# Patient Record
Sex: Female | Born: 1960 | Race: White | Hispanic: No | Marital: Married | Smoking: Never smoker
Health system: Southern US, Community
[De-identification: ages and names within clinical notes are randomized; demographics above are authoritative.]

## PROBLEM LIST (undated history)

## (undated) DIAGNOSIS — R197 Diarrhea, unspecified: Secondary | ICD-10-CM

## (undated) DIAGNOSIS — M779 Enthesopathy, unspecified: Secondary | ICD-10-CM

## (undated) DIAGNOSIS — N6009 Solitary cyst of unspecified breast: Secondary | ICD-10-CM

## (undated) DIAGNOSIS — M722 Plantar fascial fibromatosis: Secondary | ICD-10-CM

## (undated) DIAGNOSIS — K589 Irritable bowel syndrome without diarrhea: Secondary | ICD-10-CM

## (undated) DIAGNOSIS — L719 Rosacea, unspecified: Secondary | ICD-10-CM

## (undated) DIAGNOSIS — E785 Hyperlipidemia, unspecified: Secondary | ICD-10-CM

## (undated) DIAGNOSIS — R634 Abnormal weight loss: Secondary | ICD-10-CM

## (undated) DIAGNOSIS — E559 Vitamin D deficiency, unspecified: Secondary | ICD-10-CM

## (undated) DIAGNOSIS — I10 Essential (primary) hypertension: Secondary | ICD-10-CM

## (undated) HISTORY — DX: Enthesopathy, unspecified: M77.9

## (undated) HISTORY — PX: BREAST EXCISIONAL BIOPSY: SUR124

## (undated) HISTORY — DX: Rosacea, unspecified: L71.9

## (undated) HISTORY — DX: Abnormal weight loss: R63.4

## (undated) HISTORY — DX: Essential (primary) hypertension: I10

## (undated) HISTORY — DX: Solitary cyst of unspecified breast: N60.09

## (undated) HISTORY — DX: Vitamin D deficiency, unspecified: E55.9

## (undated) HISTORY — DX: Hyperlipidemia, unspecified: E78.5

## (undated) HISTORY — DX: Irritable bowel syndrome, unspecified: K58.9

## (undated) HISTORY — DX: Plantar fascial fibromatosis: M72.2

## (undated) HISTORY — DX: Diarrhea, unspecified: R19.7

---

## 1996-05-25 HISTORY — PX: VAGINAL HYSTERECTOMY: SUR661

## 1998-05-09 ENCOUNTER — Inpatient Hospital Stay (HOSPITAL_COMMUNITY): Admission: RE | Admit: 1998-05-09 | Discharge: 1998-05-11 | Payer: Self-pay | Admitting: Obstetrics and Gynecology

## 1998-11-14 ENCOUNTER — Other Ambulatory Visit: Admission: RE | Admit: 1998-11-14 | Discharge: 1998-11-14 | Payer: Self-pay | Admitting: Obstetrics and Gynecology

## 2000-01-30 ENCOUNTER — Other Ambulatory Visit: Admission: RE | Admit: 2000-01-30 | Discharge: 2000-01-30 | Payer: Self-pay | Admitting: Obstetrics and Gynecology

## 2000-05-25 HISTORY — PX: LAPAROSCOPIC CHOLECYSTECTOMY: SUR755

## 2001-04-05 ENCOUNTER — Ambulatory Visit (HOSPITAL_COMMUNITY): Admission: RE | Admit: 2001-04-05 | Discharge: 2001-04-05 | Payer: Self-pay | Admitting: Gastroenterology

## 2001-04-05 ENCOUNTER — Encounter (INDEPENDENT_AMBULATORY_CARE_PROVIDER_SITE_OTHER): Payer: Self-pay | Admitting: Specialist

## 2003-02-08 ENCOUNTER — Other Ambulatory Visit: Admission: RE | Admit: 2003-02-08 | Discharge: 2003-02-08 | Payer: Self-pay | Admitting: Obstetrics and Gynecology

## 2003-02-12 ENCOUNTER — Encounter: Admission: RE | Admit: 2003-02-12 | Discharge: 2003-02-12 | Payer: Self-pay | Admitting: Obstetrics and Gynecology

## 2003-02-12 ENCOUNTER — Encounter: Payer: Self-pay | Admitting: Obstetrics and Gynecology

## 2003-12-20 ENCOUNTER — Encounter: Admission: RE | Admit: 2003-12-20 | Discharge: 2003-12-20 | Payer: Self-pay | Admitting: Family Medicine

## 2004-01-21 ENCOUNTER — Observation Stay (HOSPITAL_COMMUNITY): Admission: RE | Admit: 2004-01-21 | Discharge: 2004-01-22 | Payer: Self-pay | Admitting: General Surgery

## 2004-01-21 ENCOUNTER — Encounter (INDEPENDENT_AMBULATORY_CARE_PROVIDER_SITE_OTHER): Payer: Self-pay | Admitting: Specialist

## 2004-05-09 ENCOUNTER — Other Ambulatory Visit: Admission: RE | Admit: 2004-05-09 | Discharge: 2004-05-09 | Payer: Self-pay | Admitting: Obstetrics and Gynecology

## 2004-07-16 ENCOUNTER — Ambulatory Visit (HOSPITAL_COMMUNITY): Admission: RE | Admit: 2004-07-16 | Discharge: 2004-07-16 | Payer: Self-pay | Admitting: Family Medicine

## 2004-10-21 ENCOUNTER — Ambulatory Visit: Payer: Self-pay | Admitting: Gastroenterology

## 2008-07-05 ENCOUNTER — Encounter: Admission: RE | Admit: 2008-07-05 | Discharge: 2008-07-05 | Payer: Self-pay | Admitting: Obstetrics and Gynecology

## 2008-07-09 ENCOUNTER — Encounter: Admission: RE | Admit: 2008-07-09 | Discharge: 2008-07-09 | Payer: Self-pay | Admitting: Obstetrics and Gynecology

## 2009-05-25 HISTORY — PX: BREAST CYST ASPIRATION: SHX578

## 2009-07-08 ENCOUNTER — Encounter: Admission: RE | Admit: 2009-07-08 | Discharge: 2009-07-08 | Payer: Self-pay | Admitting: Obstetrics and Gynecology

## 2010-05-08 ENCOUNTER — Encounter
Admission: RE | Admit: 2010-05-08 | Discharge: 2010-05-08 | Payer: Self-pay | Source: Home / Self Care | Attending: Obstetrics and Gynecology | Admitting: Obstetrics and Gynecology

## 2010-06-14 ENCOUNTER — Other Ambulatory Visit: Payer: Self-pay | Admitting: Obstetrics and Gynecology

## 2010-06-14 ENCOUNTER — Encounter: Payer: Self-pay | Admitting: Obstetrics and Gynecology

## 2010-06-14 DIAGNOSIS — Z1239 Encounter for other screening for malignant neoplasm of breast: Secondary | ICD-10-CM

## 2010-07-09 ENCOUNTER — Ambulatory Visit
Admission: RE | Admit: 2010-07-09 | Discharge: 2010-07-09 | Disposition: A | Discharge status: Home / Self Care | Payer: BC Managed Care – HMO | Source: Ambulatory Visit | Attending: Obstetrics and Gynecology | Admitting: Obstetrics and Gynecology

## 2010-07-09 DIAGNOSIS — Z1239 Encounter for other screening for malignant neoplasm of breast: Secondary | ICD-10-CM

## 2010-10-10 NOTE — Op Note (Signed)
Denise Gibson, Denise Gibson                       ACCOUNT NO.:  [id number]   MEDICAL RECORD NO.:  [id number]                   PATIENT TYPE:  OBV   LOCATION:  0457                                 FACILITY:  [facility]   PHYSICIAN:  Anselm Pancoast. Zachery Dakins, M.D.          DATE OF BIRTH:  06-18-1960   DATE OF PROCEDURE:  01/21/2004  DATE OF DISCHARGE:                                 OPERATIVE REPORT   PREOPERATIVE DIAGNOSIS:  Chronic cholecystitis with stones.   POSTOPERATIVE DIAGNOSIS:  Chronic cholecystitis with stones.   OPERATION:  Laparoscopic cholecystectomy with cholangiogram.   ANESTHESIA:  General.   SURGEON:  Dr. Consuello Bossier   ASSISTANT:  Dr. Leonie Man   HISTORY:  Ms. Babin is a 50 year old Caucasian female, who was referred to  me by Dr. Merri Brunette for symptomatic gallstones.  The patient has had  episodes of epigastric pain, had an episode for which she saw Dr. Archie Endo on December 18, 2003, who thought this was possibly gallbladder and sent  her for an ultrasound, and this showed a 2.4 cm stone with minimal sludge  within the gallbladder but no evidence of acute cholecystitis.  She has been  having episodes of indigestion for some time but never severe enough as  these episodes.  She has had a previous hysterectomy and endoscopic  procedures.  Preoperatively, her liver function studies are normal, and an  EKG was unremarkable.   She was given 3 g of Unasyn, has PAS stockings, and positioned on the OR  table.  Induction of general anesthesia, endotracheal tube, oral tube into  the stomach.  The abdomen was prepped with Betadine surgical scrub and  draped in a sterile manner.  A small incision was made below the umbilicus,  the fascia was identified, and this was picked up.  A small vertical  incision made, and a little finger pocket in the area.  The peritoneum was  kind of separated from the umbilicus, and we had to pick up the peritoneum  and made a small  opening within it.  I then placed the Hasson cannula after  a pursestring had been placed and then the cameras inserted.  She has  adhesions.  She has had a previous hysterectomy, and she has adhesions in  the upper abdomen.  Whether this has been related to the gallbladder or a  previous hysterectomy, I am not sure.  The upper 10 mm trocar was placed  under direct vision through the falciform ligament in the subxiphoid area,  and then I used the hot scissors to drop the adhesions down in the right  upper quadrant so Dr. Lurene Shadow could place the 5 mm ports.  These were placed  under direct vision.  Then the gallbladder was grasped and retracted upward.  It was necessary to take adhesions down from the duodenum to the  gallbladder, and this was done very carefully.  Then the gallbladder was  retracted  outward as well as upward.  And then first we dissected the cystic  artery which was encompassing fatty tissue from the superior medial edge of  the gallbladder.  This was doubly clipped proximally, singly distally, and  divided, and then I could encompass the little cystic duct.  This was  clipped flush with the gallbladder, and then a little opening was made.  Trying to get the [facility] catheter was difficult because of the angle, and we  finally got it which was placed in about a quarter of an inch and held in  place with a clip.  Injection x-ray was obtained under fluoroscopic vision,  and it goes promptly into the common bile duct and duodenal.  There was a  little __________ in the pancreatic duct, but there was no evidence of any  stones, and the intrahepatic radicles filled nicely.  The catheter was then  removed under direct vision.  The cystic duct was triply clipped and then  divided, and then there was a little posterior branch of the cystic artery  that was identified, doubly clipped, and then divided distally.  The  gallbladder was removed from its bed and then placed in the EndoCatch  bag.  I then switched the camera to the upper 10 mm port, withdrew the bag, and  then kind of opened the bag, pulled at the neck of the gallbladder and then  crushed the stone so I could bring it through the fascia without enlarging  it any more.  The figure-of-eight of 0 Vicryl was placed in the fascia, and  then both that and the pursestring suture were tied.  The irrigating fluid  had been used was aspirated.  The 5 mm ports were withdrawn.  I had  anesthetized all the fascia port sites with Marcaine with adrenalin.  The  subcutaneous wounds closed with 4-0 Vicryl and Benzoin and Steri-Strips on  the skin.  The patient tolerated the procedure nicely and was sent to  recovery room in a stable postoperative condition.                                               Anselm Pancoast. Zachery Dakins, M.D.    WJW/MEDQ  D:  01/21/2004  T:  01/21/2004  Job:  [id number]   cc:   Dario Guardian, M.D.  [address]  [place]  [place] [address]  Fax: [phone]

## 2011-02-23 DIAGNOSIS — N6009 Solitary cyst of unspecified breast: Secondary | ICD-10-CM

## 2011-02-23 HISTORY — DX: Solitary cyst of unspecified breast: N60.09

## 2011-03-09 HISTORY — PX: BREAST SURGERY: SHX581

## 2011-03-11 ENCOUNTER — Other Ambulatory Visit: Payer: Self-pay | Admitting: Obstetrics & Gynecology

## 2011-03-11 DIAGNOSIS — N6452 Nipple discharge: Secondary | ICD-10-CM

## 2011-03-16 ENCOUNTER — Other Ambulatory Visit: Payer: Self-pay | Admitting: Obstetrics & Gynecology

## 2011-03-16 ENCOUNTER — Ambulatory Visit
Admission: RE | Admit: 2011-03-16 | Discharge: 2011-03-16 | Disposition: A | Discharge status: Home / Self Care | Payer: BC Managed Care – HMO | Source: Ambulatory Visit | Attending: Obstetrics & Gynecology | Admitting: Obstetrics & Gynecology

## 2011-03-16 ENCOUNTER — Ambulatory Visit
Admission: RE | Admit: 2011-03-16 | Discharge: 2011-03-16 | Disposition: A | Discharge status: Home / Self Care | Payer: Self-pay | Source: Ambulatory Visit | Attending: Obstetrics & Gynecology | Admitting: Obstetrics & Gynecology

## 2011-03-16 DIAGNOSIS — N6452 Nipple discharge: Secondary | ICD-10-CM

## 2011-03-23 ENCOUNTER — Ambulatory Visit (INDEPENDENT_AMBULATORY_CARE_PROVIDER_SITE_OTHER): Payer: BC Managed Care – HMO | Admitting: General Surgery

## 2011-03-23 ENCOUNTER — Encounter (INDEPENDENT_AMBULATORY_CARE_PROVIDER_SITE_OTHER): Payer: Self-pay | Admitting: General Surgery

## 2011-03-23 VITALS — BP 142/96 | HR 64 | Temp 97.5°F | Resp 20 | Ht 63.0 in | Wt 140.2 lb

## 2011-03-23 DIAGNOSIS — D249 Benign neoplasm of unspecified breast: Secondary | ICD-10-CM

## 2011-03-23 NOTE — Progress Notes (Signed)
Chief Complaint  Patient presents with  . New Evaluation    eval right breast cyst     HPI Denise Gibson is a 50 y.o. female.   HPI This woman was referred to me by Dr. Baird Lyons at the breast center of [place].  The patient had mammograms on July 09, 2010 which were normal. Approximately one month ago she developed a spontaneous clear discharge from her right nipple. She did not feel a lump. There is not any pain or trauma. This has never happened to her before. A right breast mammogram was unremarkable. A ductogram was performed through the discharging nipple and this reveals intraluminal mass and papilloma is suspected by the radiologist.  Family history is positive for breast cancer in a maternal grandmother. There is no history of ovarian cancer. The patient has not had any history of breast problems or breast surgery. She states she has fibrocystic disease and lumpy breasts.  Past Medical History  Diagnosis Date  . Weight loss, unintentional   . Diarrhea   . IBS (irritable bowel syndrome)   . Breast cyst october 2012    rigth breast    Past Surgical History  Procedure Date  . Cholecystectomy   . Abdominal hysterectomy 1998    History reviewed. No pertinent family history.  Social History History  Substance Use Topics  . Smoking status: Never Smoker   . Smokeless tobacco: Never Used  . Alcohol Use: No    No Known Allergies  Current Outpatient Prescriptions  Medication Sig Dispense Refill  . CALCIUM PO Take by mouth daily.        Marland Kitchen Fexofenadine HCl (ALLEGRA PO) Take by mouth as needed.        . Ranitidine HCl (RANITIDINE ACID REDUCER PO) Take by mouth every hemodialysis.          Review of Systems Review of Systems  Constitutional: Negative for fever, chills and unexpected weight change.  HENT: Negative for hearing loss, congestion, sore throat, trouble swallowing and voice change.   Eyes: Negative for visual disturbance.  Respiratory: Negative  for cough and wheezing.   Cardiovascular: Negative for chest pain, palpitations and leg swelling.  Gastrointestinal: Negative for nausea, vomiting, abdominal pain, diarrhea, constipation, blood in stool, abdominal distention and anal bleeding.  Genitourinary: Negative for hematuria, vaginal bleeding and difficulty urinating.  Musculoskeletal: Negative for arthralgias.  Skin: Negative for rash and wound.  Neurological: Negative for seizures, syncope and headaches.  Hematological: Negative for adenopathy. Does not bruise/bleed easily.  Psychiatric/Behavioral: Negative for confusion.    Blood pressure 142/96, pulse 64, temperature 97.5 F (36.4 C), temperature source Temporal, resp. rate 20, height 5\' 3"  (1.6 m), weight 140 lb 4 oz (63.617 kg).  Physical Exam Physical Exam  Constitutional: She is oriented to person, place, and time. She appears well-developed and well-nourished. No distress.  HENT:  Head: Normocephalic and atraumatic.  Nose: Nose normal.  Mouth/Throat: No oropharyngeal exudate.  Eyes: Conjunctivae and EOM are normal. Pupils are equal, round, and reactive to light. Left eye exhibits no discharge. No scleral icterus.  Neck: Neck supple. No JVD present. No tracheal deviation present. No thyromegaly present.  Cardiovascular: Normal rate, regular rhythm, normal heart sounds and intact distal pulses.   No murmur heard. Pulmonary/Chest: Effort normal and breath sounds normal. No respiratory distress. She has no wheezes. She has no rales. She exhibits no tenderness.    Abdominal: Soft. Bowel sounds are normal. She exhibits no distension and no mass. There is  no tenderness. There is no rebound and no guarding.  Musculoskeletal: She exhibits no edema and no tenderness.  Lymphadenopathy:    She has no cervical adenopathy.  Neurological: She is alert and oriented to person, place, and time. She exhibits normal muscle tone. Coordination normal.  Skin: Skin is warm. No rash noted.  She is not diaphoretic. No erythema. No pallor.  Psychiatric: She has a normal mood and affect. Her behavior is normal. Judgment and thought content normal.    Data Reviewed  I reviewed her mammograms and her ductogram films and reports.  Assessment    Spontaneous nipple discharge right breast, clear in character. Abnormal ductogram suggesting intraductal papilloma.  Mild gastroesophageal reflux disease.    Plan    Schedule for excision right breast duct system with lacrimal duct probe localization.  I've discussed indications and details of surgery with the patient. Risks and complications have been outlined, including but not limited to bleeding, infection, cosmetic deformity, nerve damage with chronic pain or numbness, reoperation if this turns out to be malignant or if it fails to identify the problem, and other unforseen problems. She seems to understand the issues well. At this time all of her questions were answered. She is in full agreement with this plan.       Denise Gibson M 03/23/2011, 3:01 PM

## 2011-03-23 NOTE — Patient Instructions (Signed)
you will be scheduled for surgery to remove the mass in your right breast that is causing the discharge.  Breast Biopsy WHY YOU NEED A BIOPSY Your caregiver has recommended that you have a breast tissue sample taken (biopsy). This is done to be certain that the lump or abnormality found in your breast is not cancerous (malignant). During a biopsy, a small piece of tissue is removed, so it can be examined under a microscope by a specialist (pathologist) who looks at tissues and cells and diagnoses abnormalities in them. Most lumps (tumors) or abnormalities, on or in the breast, are not cancerous (benign). However, biopsies are taken when your caregiver cannot be absolutely certain of what is wrong only from doing a physical exam, mammogram (breast X-ray), or other studies. A breast biopsy can tell you whether nothing more needs to be done, or you need more surgery or another type of treatment. A biopsy is done when there is:  Any undiagnosed breast mass.   Nipple abnormalities, dimpling, crusting, or ulcerations.   Calcium deposits (calcifications) or abnormalities seen on your mammogram, ultrasound, or MRI.   Suspicious changes in the breast (thickening, asymmetry) seen on mammogram.   Abnormal discharge from the nipple, especially blood.   Redness, swelling, and pain of the breast.  HOW A BIOPSY IS PERFORMED A biopsy is often performed on an outpatient basis (you go home the same day). This can be done in a hospital, clinic, or surgical center. Tissue samples (biopsies) are often done under local anesthesia (area is numbed). Sometimes general anesthetics are required, in which case you sleep through the procedure. Biopsies may remove the entire lump, a small piece of the lump, or a small sliver of tissue removed by needle. TYPES OF BREAST BIOPSY  Fine needle aspiration. A thin needle is placed through the skin, to the lump or cyst, and cells are removed.   Core needle biopsy. A large needle  with a special tip is placed through the skin, to the abnormality, and a piece of tissue is removed.   Stereotactic biopsy. A core needle with a special X-ray is used, to direct the needle to the lump or abnormal area, which is difficult to feel or cannot be felt.   Vacuum-assisted biopsy. A hollow probe and a gentle vacuum remove a sample of tissue.   Ultrasound guided core needle biopsy. You lie on your stomach, with your breast through an opening, and a high frequency ultrasound helps guide the needle to the area of the abnormality.   Open biopsy. An incision is made in the breast, and a piece of the lump or the whole lump is removed.  LET YOUR CAREGIVER KNOW ABOUT:  Allergies.   Medicines taken, including herbs, eye drops, over-the-counter medicines, and creams.   Use of steroids (by mouth or creams).   Previous problems with anesthetics or Novocaine.   If you are taking aspirin or blood thinners.   Possibility of pregnancy, if this applies.   History of blood clots (thrombophlebitis).   History of bleeding or blood problems.   Previous surgery.   Other health problems.  RISKS AND COMPLICATIONS   Bleeding.   Infection.   Allergy to medicines.   Bruising and swelling of the breast.   Alteration in the shape of the breast.   Not finding the lump or abnormality.   Needing more surgery.  BEFORE THE PROCEDURE  You should arrive 60 minutes prior to your procedure or as directed.   Check-in at  the admissions desk, to fill out necessary forms, if you are not preregistered.   There will be consent forms to sign, prior to the procedure.   There is a waiting area for your family, while you are having your biopsy.   Try to have someone with you, to drive you home.   Do not smoke for 2 weeks before the surgery.   Let your caregiver know if you develop a cold or an infection.   Do not drink alcohol for at least 24 hours before surgery.   Wear a good support bra to  the surgery.  AFTER THE PROCEDURE  After surgery, you will be taken to the recovery area, where a nurse will watch and check your progress. Once you are awake, stable, and taking fluids well, if there are no other problems, you will be allowed to go home.   Ice packs applied to your operative site may help with discomfort and keep the swelling down.   You may resume normal diet and activities as directed. Avoid strenuous activities affecting the arm on the side of the biopsy, such as tennis, swimming, heavy lifting (more than 10 pounds) or pulling.   Bruising in the breast is normal following this procedure.   Wearing a support bra, even to bed, may be more comfortable. The bra will also help keep the dressing on.   Change dressings as directed.   Your doctor may apply a pressure dressing on your breast for 24 to 48 hours.   Only take over-the-counter or prescription medicines for pain, discomfort, or fever as directed by your caregiver.   Do not take aspirin, because it can cause bleeding.  HOME CARE INSTRUCTIONS   You may resume your usual diet.   Have someone drive you home after the surgery.   Do not do any exercise, driving, lifting or general activities without your caregiver's permission.   Take medicines and over-the-counter medicines, as ordered by your caregiver.   Keep your postoperative appointments as recommended.   Do not drink alcohol while taking pain medicine.  Finding out the results of your test Not all test results are available during your visit. If your test results are not back during the visit, make an appointment with your caregiver to find out the results. Do not assume everything is normal if you have not heard from your caregiver or the medical facility. It is important for you to follow up on all of your test results.  SEEK MEDICAL CARE IF:   You notice redness, swelling, or increasing pain in the wound.   You notice a bad smell coming from the  wound or dressing.   You develop a rash.   You need stronger pain medicine.   You are having an allergic reaction or problems with your medicines.  SEEK IMMEDIATE MEDICAL CARE IF:   You have difficulty breathing.   You have a fever.   There is increased bleeding (more than a small spot) from the wound.   Pus is coming from the wound.   The wound is breaking open.  Document Released: 05/11/2005 Document Revised: 01/21/2011 Document Reviewed: 03/29/2009 [facility] Patient Information 2012 [place], [place].

## 2011-03-25 HISTORY — PX: CHOLECYSTECTOMY: SHX55

## 2011-04-09 ENCOUNTER — Other Ambulatory Visit (HOSPITAL_COMMUNITY)
Admission: RE | Admit: 2011-04-09 | Discharge: 2011-04-09 | Disposition: A | Discharge status: Home / Self Care | Payer: BC Managed Care – HMO | Source: Ambulatory Visit | Attending: General Surgery | Admitting: General Surgery

## 2011-04-09 ENCOUNTER — Other Ambulatory Visit (INDEPENDENT_AMBULATORY_CARE_PROVIDER_SITE_OTHER): Payer: Self-pay | Admitting: General Surgery

## 2011-04-09 DIAGNOSIS — D249 Benign neoplasm of unspecified breast: Secondary | ICD-10-CM

## 2011-04-09 DIAGNOSIS — N6459 Other signs and symptoms in breast: Secondary | ICD-10-CM | POA: Insufficient documentation

## 2011-04-13 ENCOUNTER — Telehealth (INDEPENDENT_AMBULATORY_CARE_PROVIDER_SITE_OTHER): Payer: Self-pay

## 2011-04-13 NOTE — Telephone Encounter (Signed)
Pt notified of path result. Pt declined to make po appt. Pt states she needs to get her new schedule to set date. Pt to call back once she has schedule to set po appt date.

## 2011-04-27 ENCOUNTER — Ambulatory Visit (INDEPENDENT_AMBULATORY_CARE_PROVIDER_SITE_OTHER): Payer: BC Managed Care – HMO | Admitting: General Surgery

## 2011-04-27 ENCOUNTER — Encounter (INDEPENDENT_AMBULATORY_CARE_PROVIDER_SITE_OTHER): Payer: Self-pay | Admitting: General Surgery

## 2011-04-27 VITALS — BP 118/84 | HR 72 | Temp 96.8°F | Resp 16 | Ht 63.0 in | Wt 141.4 lb

## 2011-04-27 DIAGNOSIS — Z9889 Other specified postprocedural states: Secondary | ICD-10-CM

## 2011-04-27 NOTE — Patient Instructions (Signed)
Your right breast incision is healing very nicely. The final pathology report shows a benign papilloma. You have a copy of the pathology report. I recommended that you get mammograms yearly and have a breast exam with one of your other physicians on a yearly basis. See me if new problems arise.

## 2011-04-27 NOTE — Progress Notes (Signed)
Subjective:     Patient ID: Denise Gibson, female   DOB: 07-Mar-1961, 50 y.o.   MRN: [id number]  HPI The patient returns, status post right breast biopsy through circumareolar incision on November 15. The final report shows intraductal papilloma, no evidence of malignancy. She was given a copy of her pathology report. She has no complaints about her wound.  Review of Systems     Objective:   Physical Exam Right breast is examined. The incision is healing nicely. The breast tissues are soft. No hematoma. Excellent cosmetic result. No further drainage from the nipple.    Assessment:     Intraductal papilloma right breast, recovering uneventfully following right breast biopsy.    Plan:     She was advised to get annual mammography and annual breast exam with her primary physicians.  Return to see me p.r.n.

## 2012-09-29 ENCOUNTER — Other Ambulatory Visit: Payer: Self-pay

## 2012-09-29 DIAGNOSIS — Z1231 Encounter for screening mammogram for malignant neoplasm of breast: Secondary | ICD-10-CM

## 2012-10-11 ENCOUNTER — Encounter: Payer: Self-pay | Admitting: Obstetrics and Gynecology

## 2012-10-11 ENCOUNTER — Ambulatory Visit (INDEPENDENT_AMBULATORY_CARE_PROVIDER_SITE_OTHER): Payer: BC Managed Care – HMO | Admitting: Obstetrics and Gynecology

## 2012-10-11 VITALS — BP 134/82 | Ht 63.5 in | Wt 140.0 lb

## 2012-10-11 DIAGNOSIS — Z01419 Encounter for gynecological examination (general) (routine) without abnormal findings: Secondary | ICD-10-CM

## 2012-10-11 DIAGNOSIS — Z0000 Encounter for general adult medical examination without abnormal findings: Secondary | ICD-10-CM

## 2012-10-11 LAB — POCT URINALYSIS DIPSTICK
Bilirubin, UA: NEGATIVE
Nitrite, UA: NEGATIVE
Urobilinogen, UA: NEGATIVE

## 2012-10-11 NOTE — Patient Instructions (Signed)

## 2012-10-11 NOTE — Progress Notes (Signed)
52 y.o.   Married    Caucasian   female   G1P1001   here for annual exam.  Recently diagnosed with bilat tendonitis in elbows and had MRI on right which showed a tear.  Otherwise good year.  No other new health hx.  No LMP recorded. Patient has had a hysterectomy.          Sexually active: yes  The current method of family planning is status post hysterectomy.    Exercising: not now Last mammogram:  03/16/11 had surgery 03/2011, scheduled soon Last pap smear:2005 History of abnormal pap: no Smoking: never Alcohol: no Last colonoscopy: 20 years ago normal - encouraged to schedule Last Bone Density:  never Last tetanus shot: less than 10 years Last cholesterol check: not sure  Hgb:        neg        Urine:13.0   Family History  Problem Relation Age of Onset  . Hypertension Mother   . COPD Mother   . Fibromyalgia Mother   . Hyperlipidemia Father     There are no active problems to display for this patient.   Past Medical History  Diagnosis Date  . Weight loss, unintentional   . Diarrhea   . IBS (irritable bowel syndrome)   . Breast cyst october 2012    rigth breast  . Tendinitis     Rt arm 2011, Lt arm 01/2012    Past Surgical History  Procedure Laterality Date  . Cholecystectomy  03/25/11    Laparoscopy  . Vaginal hysterectomy  1998    DUB  . Laparoscopic cholecystectomy  2002  . Breast surgery Right 03/09/11    exc of right breast duct system   . Breast cyst aspiration Left 2011    5cc    Allergies: Review of patient's allergies indicates no known allergies.  Current Outpatient Prescriptions  Medication Sig Dispense Refill  . Acetaminophen (TYLENOL PO) Take by mouth as needed.      Marland Kitchen Fexofenadine HCl (ALLEGRA PO) Take by mouth as needed.        . Ranitidine HCl (RANITIDINE ACID REDUCER PO) Take by mouth every hemodialysis.         No current facility-administered medications for this visit.    ROS: Pertinent items are noted in HPI.  Social Hx:  Married, one child, works as a Conservator, museum/gallery and she thinks that may be why she is having pain in her elbows   Exam:    BP 134/82  Ht 5' 3.5" (1.613 m)  Wt 140 lb (63.504 kg)  BMI 24.41 kg/m2  Height stable and weight up one pound from last year Wt Readings from Last 3 Encounters:  10/11/12 140 lb (63.504 kg)  04/27/11 141 lb 6 oz (64.127 kg)  03/23/11 140 lb 4 oz (63.617 kg)     Ht Readings from Last 3 Encounters:  10/11/12 5' 3.5" (1.613 m)  04/27/11 5\' 3"  (1.6 m)  03/23/11 5\' 3"  (1.6 m)    General appearance: alert, cooperative and appears stated age Head: Normocephalic, without obvious abnormality, atraumatic Neck: no adenopathy, supple, symmetrical, trachea midline and thyroid not enlarged, symmetric, no tenderness/mass/nodules Lungs: clear to auscultation bilaterally Breasts: Inspection negative, No nipple retraction or dimpling, No nipple discharge or bleeding, No axillary or supraclavicular adenopathy, Normal to palpation without dominant masses Heart: regular rate and rhythm Abdomen: soft, non-tender; bowel sounds normal; no masses,  no organomegaly Extremities: extremities normal, atraumatic, no cyanosis or edema  Skin: Skin color, texture, turgor normal. No rashes or lesions Lymph nodes: Cervical, supraclavicular, and axillary nodes normal. No abnormal inguinal nodes palpated Neurologic: Grossly normal   Pelvic: External genitalia:  no lesions              Urethra:  normal appearing urethra with no masses, tenderness or lesions              Bartholins and Skenes: normal                 Vagina: normal appearing vagina with normal color and discharge, no lesions              Cervix: absent              Pap taken: no        Bimanual Exam:  Uterus: absent                                      Adnexa: normal adnexa in size, nontender and no masses                                      Rectovaginal: Confirms                                      Anus:  normal  sphincter tone, no lesions  A: normal peri-menopausal exam     S/p TVH     P: mammogram counseled on breast self exam, mammography screening, adequate intake of calcium and vitamin D, diet and exercise return annually or prn     An After Visit Summary was printed and given to the patient.

## 2012-10-26 ENCOUNTER — Ambulatory Visit
Admission: RE | Admit: 2012-10-26 | Discharge: 2012-10-26 | Disposition: A | Discharge status: Home / Self Care | Payer: BC Managed Care – PPO | Source: Ambulatory Visit

## 2012-10-26 DIAGNOSIS — Z1231 Encounter for screening mammogram for malignant neoplasm of breast: Secondary | ICD-10-CM

## 2013-01-04 ENCOUNTER — Telehealth: Payer: Self-pay | Admitting: Obstetrics and Gynecology

## 2013-01-04 NOTE — Telephone Encounter (Signed)
Patient needs to have referral for Dr. Randa Evens for Colonoscopy . Please call to let her know the details. Patient states that Dr. Tresa Res said she needs it for her yearly

## 2013-01-04 NOTE — Telephone Encounter (Signed)
Patient stated she would need referral to Dr. Carman Ching office for consult colonoscopy. Called [facility] office to give referral and was told one was not necessary. Receptionist stated that Denise Gibson had already called their office and was told that they needed her previous records from gastroenterologist and waiting on those records for review then patient will be given appt.. Patient notified of this and is concerned that her insurance will require a referral for appt..  Patient is to call [facility] office back and confirm this.

## 2013-01-04 NOTE — Telephone Encounter (Signed)
Left message on CB# of need to return call concerning referral for colonoscopy.

## 2013-03-28 ENCOUNTER — Telehealth: Payer: Self-pay | Admitting: Obstetrics and Gynecology

## 2013-03-28 NOTE — Telephone Encounter (Signed)
Patient is having hot flashes for the last few months gotten more frequent thinks she may be premenopause

## 2013-03-29 NOTE — Telephone Encounter (Signed)
Message left to return call to Antjuan Rothe at [phone].    

## 2013-03-29 NOTE — Telephone Encounter (Signed)
Appt scheduled with Dr. Edward Jolly to discuss hot flashes.

## 2013-03-31 ENCOUNTER — Ambulatory Visit (INDEPENDENT_AMBULATORY_CARE_PROVIDER_SITE_OTHER): Payer: BC Managed Care – HMO | Admitting: Obstetrics and Gynecology

## 2013-03-31 ENCOUNTER — Encounter: Payer: Self-pay | Admitting: Obstetrics and Gynecology

## 2013-03-31 DIAGNOSIS — N951 Menopausal and female climacteric states: Secondary | ICD-10-CM

## 2013-03-31 MED ORDER — ESTRADIOL 0.5 MG PO TABS: 0.5000 mg | ORAL_TABLET | Freq: Every day | ORAL | Status: DC

## 2013-03-31 NOTE — Progress Notes (Signed)
Patient ID: Denise Gibson, female   DOB: 1960-06-13, 52 y.o.   MRN: [id number] GYNECOLOGY PROBLEM VISIT  PCP:   Merri Brunette, MD  Referring provider:   HPI: 52 y.o.   Married  Caucasian  female   G1P1001 with No LMP recorded. Patient has had a hysterectomy.   here for  Discussion regarding hot flashes.  Some night sweats. 6 - 7 hot flashes per day. Sleeps well. Up to void at night once to twice a night. Good cognitive function.  Some vaginal dryness.   Maternal grandmother with breast cancer in menopause. Patient's mother is having evaluation for breast atypia currently.    Patient states that she has "running fits"  An overwhelming sense of gloom and doom. Has been occurring since adulthood.  Happens once to twice a year.   No particular anxiety.  No pain.  Some nausea but no vomiting when it happens.   Increased recently. No known stress.   Patient's mother also has this.   GYNECOLOGIC HISTORY: No LMP recorded. Patient has had a hysterectomy. Sexually active:  yes Partner preference: males Contraception: hysterectomy   Menopausal hormone therapy: no DES exposure:   no Blood transfusions:   no Sexually transmitted diseases:   no GYN Procedures:  Total vaginal hysterectomy Mammogram:    10-26-12 wnl       Status post excision of right breast papilloma 2012.       Pap:  ?2005 wnl  History of abnormal pap smear:  no   OB History   Grav Para Term Preterm Abortions TAB SAB Ect Mult Living   1 1 1       1          Family History  Problem Relation Age of Onset  . Hypertension Mother   . COPD Mother   . Fibromyalgia Mother   . Hyperlipidemia Father     There are no active problems to display for this patient.   Past Medical History  Diagnosis Date  . Weight loss, unintentional   . Diarrhea   . IBS (irritable bowel syndrome)   . Breast cyst october 2012    rigth breast  . Tendinitis     Rt arm 2011, Lt arm 01/2012    Past Surgical History  Procedure  Laterality Date  . Cholecystectomy  03/25/11    Laparoscopy  . Vaginal hysterectomy  1998    DUB  . Laparoscopic cholecystectomy  2002  . Breast surgery Right 03/09/11    exc of right breast duct system   . Breast cyst aspiration Left 2011    5cc    ALLERGIES: Review of patient's allergies indicates no known allergies.  Current Outpatient Prescriptions  Medication Sig Dispense Refill  . Acetaminophen (TYLENOL PO) Take by mouth as needed.      . calcium carbonate (OS-CAL) 600 MG TABS tablet Take 600 mg by mouth 2 (two) times daily with a meal.      . Calcium Carbonate-Vitamin D (CALCIUM 600+D PO) Take by mouth.      . Fexofenadine HCl (ALLEGRA PO) Take by mouth as needed.        . Ranitidine HCl (RANITIDINE ACID REDUCER PO) Take by mouth every hemodialysis.         No current facility-administered medications for this visit.     ROS:  Pertinent items are noted in HPI.  SOCIAL HISTORY:    PHYSICAL EXAMINATION:    There were no vitals taken for this visit.  Wt Readings from Last 3 Encounters:  10/11/12 140 lb (63.504 kg)  04/27/11 141 lb 6 oz (64.127 kg)  03/23/11 140 lb 4 oz (63.617 kg)     Ht Readings from Last 3 Encounters:  10/11/12 5' 3.5" (1.613 m)  04/27/11 5\' 3"  (1.6 m)  03/23/11 5\' 3"  (1.6 m)    General appearance: alert, cooperative and appears stated age Neurologic: Grossly normal  ASSESSMENT  Menopausal symptoms.  No contraindications to initiation of ERT. Possible anxiety.  PLAN  Menopausal symptoms discussed in detail with patient. Options for treatment including complimentary medicine, estrogen therapy, and Brisdelle discussed in detail including efficacy, risks and benefits of each.  Patient opts for estrogen therapy.  Will prescribe Estrace 0.5 mg daily.  Discussed potential risk of DVT, PE, MI, stroke, breast cancer while taking estrogens, although some of these risks as less likely due to estrogen alone therapy.  Medications per Epic  orders Return in three months.    An After Visit Summary was printed and given to the patient.  20 minutes face to face time of which over 50% was spent in counseling the patient.

## 2013-03-31 NOTE — Patient Instructions (Signed)
Estradiol tablets What is this medicine? ESTRADIOL (es tra DYE ole) is an estrogen. It is mostly used as hormone replacement in menopausal women. It helps to treat hot flashes and prevent osteoporosis. It is also used to treat women with low estrogen levels or those who have had their ovaries removed. This medicine may be used for other purposes; ask your health care provider or pharmacist if you have questions. COMMON BRAND NAME(S): Estrace, Femtrace, Gynodiol  What should I tell my health care provider before I take this medicine? They need to know if you have or ever had any of these conditions: -abnormal vaginal bleeding -blood vessel disease or blood clots -breast, cervical, endometrial, ovarian, liver, or uterine cancer -dementia -diabetes -gallbladder disease -heart disease or recent heart attack -high blood pressure -high cholesterol -high level of calcium in the blood -hysterectomy -kidney disease -liver disease -migraine headaches -protein C deficiency -protein S deficiency -stroke -systemic lupus erythematosus (SLE) -tobacco smoker -an unusual or allergic reaction to estrogens, other hormones, medicines, foods, dyes, or preservatives -pregnant or trying to get pregnant -breast-feeding How should I use this medicine? Take this medicine by mouth. To reduce nausea, this medicine may be taken with food. Follow the directions on the prescription label. Take this medicine at the same time each day and in the order directed on the package. Do not take your medicine more often than directed. Contact your pediatrician regarding the use of this medicine in children. Special care may be needed. A patient package insert for the product will be given with each prescription and refill. Read this sheet carefully each time. The sheet may change frequently. Overdosage: If you think you have taken too much of this medicine contact a poison control center or emergency room at once. NOTE:  This medicine is only for you. Do not share this medicine with others. What if I miss a dose? If you miss a dose, take it as soon as you can. If it is almost time for your next dose, take only that dose. Do not take double or extra doses. What may interact with this medicine? Do not take this medicine with any of the following medications: -aromatase inhibitors like aminoglutethimide, anastrozole, exemestane, letrozole, testolactone This medicine may also interact with the following medications: -carbamazepine -certain antibiotics used to treat infections -certain barbiturates or benzodiazepines used for inducing sleep or treating seizures -grapefruit juice -medicines for fungus infections like itraconazole and ketoconazole -raloxifene or tamoxifen -rifabutin, rifampin, or rifapentine -ritonavir -St. John's Wort -warfarin This list may not describe all possible interactions. Give your health care provider a list of all the medicines, herbs, non-prescription drugs, or dietary supplements you use. Also tell them if you smoke, drink alcohol, or use illegal drugs. Some items may interact with your medicine. What should I watch for while using this medicine? Visit your doctor or health care professional for regular checks on your progress. You will need a regular breast and pelvic exam and Pap smear while on this medicine. You should also discuss the need for regular mammograms with your health care professional, and follow his or her guidelines for these tests. This medicine can make your body retain fluid, making your fingers, hands, or ankles swell. Your blood pressure can go up. Contact your doctor or health care professional if you feel you are retaining fluid. If you have any reason to think you are pregnant, stop taking this medicine right away and contact your doctor or health care professional. Smoking increases the risk   of getting a blood clot or having a stroke while you are taking this  medicine, especially if you are more than 52 years old. You are strongly advised not to smoke. If you wear contact lenses and notice visual changes, or if the lenses begin to feel uncomfortable, consult your eye doctor or health care professional. This medicine can increase the risk of developing a condition (endometrial hyperplasia) that may lead to cancer of the lining of the uterus. Taking progestins, another hormone drug, with this medicine lowers the risk of developing this condition. Therefore, if your uterus has not been removed (by a hysterectomy), your doctor may prescribe a progestin for you to take together with your estrogen. You should know, however, that taking estrogens with progestins may have additional health risks. You should discuss the use of estrogens and progestins with your health care professional to determine the benefits and risks for you. If you are going to have surgery, you may need to stop taking this medicine. Consult your health care professional for advice before you schedule the surgery. What side effects may I notice from receiving this medicine? Side effects that you should report to your doctor or health care professional as soon as possible: -allergic reactions like skin rash, itching or hives, swelling of the face, lips, or tongue -breast tissue changes or discharge -changes in vision -chest pain -confusion, trouble speaking or understanding -dark urine -general ill feeling or flu-like symptoms -light-colored stools -nausea, vomiting -pain, swelling, warmth in the leg -right upper belly pain -severe headaches -shortness of breath -sudden numbness or weakness of the face, arm or leg -trouble walking, dizziness, loss of balance or coordination -unusual vaginal bleeding -yellowing of the eyes or skin Side effects that usually do not require medical attention (report to your doctor or health care professional if they continue or are bothersome): -hair  loss -increased hunger or thirst -increased urination -symptoms of vaginal infection like itching, irritation or unusual discharge -unusually weak or tired This list may not describe all possible side effects. Call your doctor for medical advice about side effects. You may report side effects to FDA at 1-800-FDA-1088. Where should I keep my medicine? Keep out of the reach of children. Store at room temperature between 20 and 25 degrees C (68 and 77 degrees F). Keep container tightly closed. Protect from light. Throw away any unused medicine after the expiration date. NOTE: This sheet is a summary. It may not cover all possible information. If you have questions about this medicine, talk to your doctor, pharmacist, or health care provider.  2014, [employer]. (2010-08-13 09:19:24)  

## 2013-06-30 ENCOUNTER — Telehealth: Payer: Self-pay | Admitting: Emergency Medicine

## 2013-06-30 MED ORDER — ESTRADIOL 0.5 MG PO TABS: 0.5000 mg | ORAL_TABLET | Freq: Every day | ORAL | Status: DC

## 2013-06-30 NOTE — Telephone Encounter (Signed)
Spoke with patient. She states that her symptoms are 85-90% better and she states "I am feeling good!".  She states for the first 30 days on Estradiol 0.5 mg she had no hot flashes, since then she has hot flashes about every 2-3 days but they are less in intensity and "not as awful." She is agreeable to reschedule of appointment and appointment rescheduled for 2/20 at 3:30 with Dr. Quincy Simmonds. Refilled Estradiol until next appointment. Patient will call back with any concerns prior to appointment.   Routing to provider for final review. Patient agreeable to disposition. Will close encounter

## 2013-06-30 NOTE — Telephone Encounter (Signed)
Message left to return call.  We need to reschedule appointment with Dr. Quincy Simmonds.  Can assess how she is doing on Estrace and if doing well, can refill until next follow up scheduled, okay with Dr. Quincy Simmonds.

## 2013-07-03 ENCOUNTER — Ambulatory Visit: Payer: BC Managed Care – HMO | Admitting: Obstetrics and Gynecology

## 2013-07-12 ENCOUNTER — Telehealth: Payer: Self-pay | Admitting: Obstetrics and Gynecology

## 2013-07-12 NOTE — Telephone Encounter (Signed)
Routing to provider for final review. Patient agreeable to disposition. Will close encounter.     

## 2013-07-12 NOTE — Telephone Encounter (Signed)
Patient canceled her appt on Friday for a 57mth reck. Patient said she is just unable to come that day. i dont have anything anytime soon i can put her in. Can you find her an appt.

## 2013-07-12 NOTE — Telephone Encounter (Signed)
This is a medication recheck, ok to delay a little.  Offer the afternoon slots on 07-27-13. We have given her  A one month refill so she should be ok.

## 2013-07-12 NOTE — Telephone Encounter (Signed)
Called pt scheduled her for 07-27-13 at 3:00.

## 2013-07-14 ENCOUNTER — Ambulatory Visit: Payer: BC Managed Care – HMO | Admitting: Obstetrics and Gynecology

## 2013-07-27 ENCOUNTER — Ambulatory Visit (INDEPENDENT_AMBULATORY_CARE_PROVIDER_SITE_OTHER): Payer: BC Managed Care – PPO | Admitting: Obstetrics and Gynecology

## 2013-07-27 ENCOUNTER — Encounter: Payer: Self-pay | Admitting: Obstetrics and Gynecology

## 2013-07-27 VITALS — BP 118/84 | HR 73 | Resp 14 | Wt 148.0 lb

## 2013-07-27 DIAGNOSIS — H532 Diplopia: Secondary | ICD-10-CM

## 2013-07-27 DIAGNOSIS — N951 Menopausal and female climacteric states: Secondary | ICD-10-CM

## 2013-07-27 NOTE — Patient Instructions (Addendum)
Please stop your estrogen therapy today.        Paroxetine capsules What is this medicine? PAROXETINE (pa ROX e teen) is used to treat hot flashes due to menopause. This medicine may be used for other purposes; ask your health care provider or pharmacist if you have questions. COMMON BRAND NAME(S): Brisdelle What should I tell my health care provider before I take this medicine? They need to know if you have any of these conditions: -bleeding disorders -glaucoma -heart disease -kidney disease -liver disease -low levels of sodium in the blood -mania or bipolar disorder -seizures -suicidal thoughts, plans, or attempt; a previous suicide attempt by you or a family member -take MAOIs like Carbex, Eldepryl, Marplan, Nardil, and Parnate -take medicines that treat or prevent blood clots -an unusual or allergic reaction to paroxetine, other medicines, foods, dyes, or preservatives -pregnant or trying to get pregnant -breast-feeding How should I use this medicine? Take this medicine by mouth once daily at bedtime. Follow the directions on the prescription label. This medicine can be taken with or without food. Take your medicine at regular intervals. Do not take your medicine more often than directed. A special MedGuide will be given to you by the pharmacist with each prescription and refill. Be sure to read this information carefully each time. Overdosage: If you think you've taken too much of this medicine contact a poison control center or emergency room at once. Overdosage: If you think you have taken too much of this medicine contact a poison control center or emergency room at once. NOTE: This medicine is only for you. Do not share this medicine with others. What if I miss a dose? If you miss a dose, take it as soon as you can. If it is almost time for your next dose, take only that dose. Do not take double or extra doses. What may interact with this medicine? Do not take this  medicine with any of the following medications: -linezolid -MAOIs like Carbex, Eldepryl, Marplan, Nardil, and Parnate -methylene blue (injected into a vein) -pimozide -thioridazine This medicine may also interact with the following medications: -alcohol -aspirin and aspirin-like medicines -atomoxetine -certain medicines for depression, anxiety, or psychotic disturbances -certain medicines for irregular heart beat like propafenone, flecainide, encainide, and quinidine -certain medicines for migraine headache like almotriptan, eletriptan, frovatriptan, naratriptan, rizatriptan, sumatriptan, zolmitriptan -cimetidine -digoxin -diuretics -fentanyl -fosamprenavir -furazolidone -isoniazid -lithium -medicines that treat or prevent blood clots like warfarin, enoxaparin, and dalteparin -medicines for sleep -NSAIDs, medicines for pain and inflammation, like ibuprofen or naproxen -phenobarbital -phenytoin -procarbazine -rasagiline -ritonavir -supplements like St. John's wort, kava kava, valerian -tamoxifen -tramadol -tryptophan This list may not describe all possible interactions. Give your health care provider a list of all the medicines, herbs, non-prescription drugs, or dietary supplements you use. Also tell them if you smoke, drink alcohol, or use illegal drugs. Some items may interact with your medicine. What should I watch for while using this medicine? Tell your doctor or healthcare professional if your symptoms do not start to get better or if they get worse. Visit your doctor or health care professional for regular checks on your progress. Patients and their families should watch out for new or worsening thoughts of suicide or depression. Also watch out for sudden changes in feelings such as feeling anxious, agitated, panicky, irritable, hostile, aggressive, impulsive, severely restless, overly excited and hyperactive, or not being able to sleep. If this happens, especially at the  beginning of treatment or after a change in  dose, call your health care professional. Dennis Bast may get drowsy or dizzy. Do not drive, use machinery, or do anything that needs mental alertness until you know how this medicine affects you. Do not stand or sit up quickly, especially if you are an older patient. This reduces the risk of dizzy or fainting spells. Alcohol may interfere with the effect of this medicine. Avoid alcoholic drinks. Your mouth may get dry. Chewing sugarless gum, sucking hard candy and drinking plenty of water will help. Contact your doctor if the problem does not go away or is severe. Women should inform their doctor if they wish to become pregnant or think they might be pregnant. There is a potential for serious side effects to an unborn child. Talk to your health care professional or pharmacist for more information. Do not become pregnant while taking this medicine. What side effects may I notice from receiving this medicine? Side effects that you should report to your doctor or health care professional as soon as possible: -allergic reactions like skin rash, itching or hives, swelling of the face, lips, or tongue -changes in emotions or moods -confusion -depression -feeling faint or lightheaded, falls -seizures -suicidal thoughts or actions -unusual bleeding or bruising -unusually weak or tired -weakness Side effects that usually do not require medical attention (Report these to your doctor or health care professional if they continue or are bothersome.): -change in sex drive or performance -fatigue -drowsiness -headache -insomnia -nausea/vomiting -upset stomach This list may not describe all possible side effects. Call your doctor for medical advice about side effects. You may report side effects to FDA at 1-800-FDA-1088. Where should I keep my medicine? Keep out of the reach of children. Store at room temperature between 20 and 25 degrees C (68 and 77 degrees F). Throw  away any unused medicine after the expiration date. NOTE: This sheet is a summary. It may not cover all possible information. If you have questions about this medicine, talk to your doctor, pharmacist, or health care provider.  2014, Elsevier/Gold Standard. (2012-01-11 12:26:17)  Venlafaxine extended-release capsules What is this medicine? VENLAFAXINE(VEN la fax een) is used to treat depression, anxiety and panic disorder. This medicine may be used for other purposes; ask your health care provider or pharmacist if you have questions. COMMON BRAND NAME(S): Effexor XR What should I tell my health care provider before I take this medicine? They need to know if you have any of these conditions: -bleeding disorders -glaucoma -heart disease -high blood pressure -high cholesterol -kidney disease -liver disease -low levels of sodium in the blood -mania or bipolar disorder -seizures -suicidal thoughts, plans, or attempt; a previous suicide attempt by you or a family -take medicines that treat or prevent blood clots -thyroid disease -an unusual or allergic reaction to venlafaxine, desvenlafaxine, other medicines, foods, dyes, or preservatives -pregnant or trying to get pregnant -breast-feeding How should I use this medicine? Take this medicine by mouth with a full glass of water. Follow the directions on the prescription label. Do not cut, crush, or chew this medicine. Take it with food. If needed, the capsule may be carefully opened and the entire contents sprinkled on a spoonful of cool applesauce. Swallow the applesauce/pellet mixture right away without chewing and follow with a glass of water to ensure complete swallowing of the pellets. Try to take your medicine at about the same time each day. Do not take your medicine more often than directed. Do not stop taking this medicine suddenly except upon the advice  of your doctor. Stopping this medicine too quickly may cause serious side effects or  your condition may worsen. A special MedGuide will be given to you by the pharmacist with each prescription and refill. Be sure to read this information carefully each time. Talk to your pediatrician regarding the use of this medicine in children. Special care may be needed. Overdosage: If you think you have taken too much of this medicine contact a poison control center or emergency room at once. NOTE: This medicine is only for you. Do not share this medicine with others. What if I miss a dose? If you miss a dose, take it as soon as you can. If it is almost time for your next dose, take only that dose. Do not take double or extra doses. What may interact with this medicine? Do not take this medicine with any of the following medications: -certain medicines for fungal infections like fluconazole, itraconazole, ketoconazole, posaconazole, voriconazole -cisapride -desvenlafaxine -dofetilide -dronedarone -duloxetine -levomilnacipran -linezolid -MAOIs like Carbex, Eldepryl, Marplan, Nardil, and Parnate -methylene blue (injected into a vein) -milnacipran -pimozide -thioridazine -ziprasidone  This medicine may also interact with the following medications: -aspirin and aspirin-like medicines -certain medicines for depression, anxiety, or psychotic disturbances -certain medicines for migraine headaches like almotriptan, eletriptan, frovatriptan, naratriptan, rizatriptan, sumatriptan, zolmitriptan -certain medicines for sleep -certain medicines that treat or prevent blood clots like dalteparin, enoxaparin, warfarin -cimetidine -clozapine -diuretics -fentanyl -furazolidone -indinavir -isoniazid -lithium -metoprolol -NSAIDS, medicines for pain and inflammation, like ibuprofen or naproxen -other medicines that prolong the QT interval (cause an abnormal heart rhythm) -procarbazine -rasagiline -supplements like St. John's wort, kava kava, valerian -tramadol -tryptophan This list may  not describe all possible interactions. Give your health care provider a list of all the medicines, herbs, non-prescription drugs, or dietary supplements you use. Also tell them if you smoke, drink alcohol, or use illegal drugs. Some items may interact with your medicine. What should I watch for while using this medicine? Tell your doctor if your symptoms do not get better or if they get worse. Visit your doctor or health care professional for regular checks on your progress. Because it may take several weeks to see the full effects of this medicine, it is important to continue your treatment as prescribed by your doctor. Patients and their families should watch out for new or worsening thoughts of suicide or depression. Also watch out for sudden changes in feelings such as feeling anxious, agitated, panicky, irritable, hostile, aggressive, impulsive, severely restless, overly excited and hyperactive, or not being able to sleep. If this happens, especially at the beginning of treatment or after a change in dose, call your health care professional. This medicine can cause an increase in blood pressure. Check with your doctor for instructions on monitoring your blood pressure while taking this medicine. You may get drowsy or dizzy. Do not drive, use machinery, or do anything that needs mental alertness until you know how this medicine affects you. Do not stand or sit up quickly, especially if you are an older patient. This reduces the risk of dizzy or fainting spells. Alcohol may interfere with the effect of this medicine. Avoid alcoholic drinks. Your mouth may get dry. Chewing sugarless gum, sucking hard candy and drinking plenty of water will help. Contact your doctor if the problem does not go away or is severe. What side effects may I notice from receiving this medicine? Side effects that you should report to your doctor or health care professional as soon as  possible: -allergic reactions like skin rash,  itching or hives, swelling of the face, lips, or tongue -breathing problems -changes in vision -hallucination, loss of contact with reality -seizures -suicidal thoughts or other mood changes -trouble passing urine or change in the amount of urine -unusual bleeding or bruising Side effects that usually do not require medical attention (report to your doctor or health care professional if they continue or are bothersome): -change in sex drive or performance -constipation -increased sweating -loss of appetite -nausea -tremors -weight loss This list may not describe all possible side effects. Call your doctor for medical advice about side effects. You may report side effects to FDA at 1-800-FDA-1088. Where should I keep my medicine? Keep out of the reach of children. Store at a controlled temperature between 20 and 25 degrees C (68 degrees and 77 degrees F), in a dry place. Throw away any unused medicine after the expiration date. NOTE: This sheet is a summary. It may not cover all possible information. If you have questions about this medicine, talk to your doctor, pharmacist, or health care provider.  2014, Elsevier/Gold Standard. (2012-12-06 12:46:03)

## 2013-07-27 NOTE — Progress Notes (Signed)
GYNECOLOGY  VISIT   HPI: 53 y.o.   Married  Caucasian  female   G1P1001 with No LMP recorded. Patient has had a hysterectomy.   here for   3 month F/U menopause symptoms (Hotflashes, Night Sweats) Started Estrace 0.5 mg.  Hot flashes resolved.   Notes that she has double vision, since prior to starting ERT, but that this is worse now after starting her hormone therapy.  Saw opthalmologist and was told that vision change may be due to new glasses. She notes some lessening of acuity.  Needs to take off her glasses.  Wants to stop the estrogen.   GYNECOLOGIC HISTORY: No LMP recorded. Patient has had a hysterectomy. Contraception:   TVH Menopausal hormone therapy: yes  Estradiol        OB History   Grav Para Term Preterm Abortions TAB SAB Ect Mult Living   1 1 1       1          There are no active problems to display for this patient.   Past Medical History  Diagnosis Date  . Weight loss, unintentional   . Diarrhea   . IBS (irritable bowel syndrome)   . Breast cyst october 2012    rigth breast  . Tendinitis     Rt arm 2011, Lt arm 01/2012    Past Surgical History  Procedure Laterality Date  . Cholecystectomy  03/25/11    Laparoscopy  . Vaginal hysterectomy  1998    DUB  . Laparoscopic cholecystectomy  2002  . Breast surgery Right 03/09/11    exc of right breast duct system   . Breast cyst aspiration Left 2011    5cc    Current Outpatient Prescriptions  Medication Sig Dispense Refill  . Acetaminophen (TYLENOL PO) Take by mouth as needed.      . Calcium Carbonate-Vitamin D (CALCIUM 600+D PO) Take by mouth daily.       Marland Kitchen estradiol (ESTRACE) 0.5 MG tablet Take 1 tablet (0.5 mg total) by mouth daily.  30 tablet  0  . Fexofenadine HCl (ALLEGRA PO) Take by mouth as needed.        . Ranitidine HCl (RANITIDINE ACID REDUCER PO) Take by mouth as needed.        No current facility-administered medications for this visit.     ALLERGIES: Review of patient's allergies  indicates no known allergies.  Family History  Problem Relation Age of Onset  . Hypertension Mother   . COPD Mother   . Fibromyalgia Mother   . Hyperlipidemia Father   . Breast cancer Maternal Grandmother     History   Social History  . Marital Status: Married    Spouse Name: N/A    Number of Children: N/A  . Years of Education: N/A   Occupational History  . Not on file.   Social History Main Topics  . Smoking status: Never Smoker   . Smokeless tobacco: Never Used  . Alcohol Use: No  . Drug Use: No  . Sexual Activity: Yes    Partners: Male    Birth Control/ Protection: Surgical     Comment: TVH   Other Topics Concern  . Not on file   Social History Narrative  . No narrative on file    ROS:  Pertinent items are noted in HPI.  PHYSICAL EXAMINATION:    BP 118/84  Pulse 73  Resp 14  Wt 148 lb (67.132 kg)     General appearance: alert,  cooperative and appears stated age   ASSESSMENT  Menopausal symptoms. On Estrace.  Diplopia.  Loss of visual acuity.   PLAN  OK to stop estrogen therapy.  We discussed visual changes as a rare side effect of estrogen treatment.  Counseled on herbal treatments - black cohosh and soy and Brisdelle or Effexor in the future if desired.  Return to opthalmology.   An After Visit Summary was printed and given to the patient.  __15____ minutes face to face time of which over 50% was spent in counseling.

## 2013-09-18 ENCOUNTER — Other Ambulatory Visit: Payer: Self-pay

## 2013-09-18 DIAGNOSIS — Z1231 Encounter for screening mammogram for malignant neoplasm of breast: Secondary | ICD-10-CM

## 2013-11-01 ENCOUNTER — Ambulatory Visit
Admission: RE | Admit: 2013-11-01 | Discharge: 2013-11-01 | Disposition: A | Discharge status: Home / Self Care | Payer: BC Managed Care – PPO | Source: Ambulatory Visit

## 2013-11-01 ENCOUNTER — Encounter (INDEPENDENT_AMBULATORY_CARE_PROVIDER_SITE_OTHER): Payer: Self-pay

## 2013-11-01 DIAGNOSIS — Z1231 Encounter for screening mammogram for malignant neoplasm of breast: Secondary | ICD-10-CM

## 2013-11-03 ENCOUNTER — Encounter: Payer: Self-pay | Admitting: Obstetrics and Gynecology

## 2013-11-03 ENCOUNTER — Ambulatory Visit: Payer: BC Managed Care – HMO | Admitting: Obstetrics and Gynecology

## 2013-11-03 ENCOUNTER — Ambulatory Visit (INDEPENDENT_AMBULATORY_CARE_PROVIDER_SITE_OTHER): Payer: BC Managed Care – PPO | Admitting: Obstetrics and Gynecology

## 2013-11-03 VITALS — BP 138/70 | HR 72 | Resp 18 | Ht 64.0 in | Wt 148.0 lb

## 2013-11-03 DIAGNOSIS — Z0000 Encounter for general adult medical examination without abnormal findings: Secondary | ICD-10-CM

## 2013-11-03 DIAGNOSIS — Z01419 Encounter for gynecological examination (general) (routine) without abnormal findings: Secondary | ICD-10-CM

## 2013-11-03 LAB — POCT URINALYSIS DIPSTICK
BILIRUBIN UA: NEGATIVE
Blood, UA: NEGATIVE
GLUCOSE UA: NEGATIVE
KETONES UA: NEGATIVE
Leukocytes, UA: NEGATIVE
Nitrite, UA: NEGATIVE
Protein, UA: NEGATIVE
UROBILINOGEN UA: NEGATIVE
pH, UA: 6

## 2013-11-03 LAB — CBC
HEMATOCRIT: 38.3 % (ref 36.0–46.0)
Hemoglobin: 12.6 g/dL (ref 12.0–15.0)
MCH: 28.1 pg (ref 26.0–34.0)
MCHC: 32.9 g/dL (ref 30.0–36.0)
MCV: 85.5 fL (ref 78.0–100.0)
Platelets: 269 10*3/uL (ref 150–400)
RBC: 4.48 MIL/uL (ref 3.87–5.11)
RDW: 13.7 % (ref 11.5–15.5)
WBC: 5.9 10*3/uL (ref 4.0–10.5)

## 2013-11-03 LAB — HEMOGLOBIN, FINGERSTICK: HEMOGLOBIN, FINGERSTICK: 12.7 g/dL (ref 12.0–16.0)

## 2013-11-03 NOTE — Progress Notes (Signed)
GYNECOLOGY VISIT  PCP: Carol Ada  Referring provider:   HPI: 53 y.o.   Married  Caucasian  female   G1P1001 with No LMP recorded. Patient has had a hysterectomy.   here for   Annual Gynecological Exam  Stopped ERT due to concern about visual changes.  Found out is was probably from a problem with her prescription eye glasses.  Using soy milk. Some hot flashes in the last 2 weeks.   Always had had urgency to void and then trickles.  Voiding pattern depends during the day. Up 1 - 2 times a night to void.  Leaks with sneezing, but not consistent.  Wears pad for freshness.   Some diarrhea. "Had it forever." Can have some leakage of stool if having diarrhea.  Has symptoms of IBS. Some constipation - due to calcium?  History of right breast mass removal in 2012 for a benign breast lump - intraductal papilloma.  History of left breast cyst, which has been drained. Comes and goes.   Hgb:   Urine: Clear   GYNECOLOGIC HISTORY: No LMP recorded. Patient has had a hysterectomy. Sexually active:  yes Partner preference: Female Contraception: Hysterectomy   Menopausal hormone therapy: no DES exposure:  No  Blood transfusions:  No  Sexually transmitted diseases: No    GYN procedures and prior surgeries:  Total vaginal hysterectomy 1998 for abnormal uterine bleeding.  Last mammogram:   11/02/13 BIRADS-0 - possible mass in the right breast.       Last pap and high risk HPV testing:  ? History of abnormal pap smear: ?    OB History   Grav Para Term Preterm Abortions TAB SAB Ect Mult Living   1 1 1       1        LIFESTYLE: Exercise:   No            Tobacco: No Alcohol: No Drug use:  No  OTHER HEALTH MAINTENANCE: Tetanus/TDap: 2010 Gardisil: No Influenza:  No Zostavax: No  Bone density: N/A Colonoscopy: over 20 years. In the process of scheduling one  Cholesterol check: ?  Family History  Problem Relation Age of Onset  . Hypertension Mother   . COPD Mother   .  Fibromyalgia Mother   . Hyperlipidemia Father   . Breast cancer Maternal Grandmother     There are no active problems to display for this patient.  Past Medical History  Diagnosis Date  . Weight loss, unintentional   . Diarrhea   . IBS (irritable bowel syndrome)   . Breast cyst october 2012    rigth breast  . Tendinitis     Rt arm 2011, Lt arm 01/2012    Past Surgical History  Procedure Laterality Date  . Cholecystectomy  03/25/11    Laparoscopy  . Vaginal hysterectomy  1998    DUB  . Laparoscopic cholecystectomy  2002  . Breast surgery Right 03/09/11    exc of right breast duct system   . Breast cyst aspiration Left 2011    5cc    ALLERGIES: Review of patient's allergies indicates no known allergies.  Current Outpatient Prescriptions  Medication Sig Dispense Refill  . Acetaminophen (TYLENOL PO) Take by mouth as needed.      . Calcium Carbonate-Vitamin D (CALCIUM 600+D PO) Take by mouth daily.       Marland Kitchen Fexofenadine HCl (ALLEGRA PO) Take by mouth as needed.        . Ranitidine HCl (RANITIDINE ACID REDUCER PO)  Take by mouth as needed.        No current facility-administered medications for this visit.     ROS:  Pertinent items are noted in HPI.  SOCIAL HISTORY:    PHYSICAL EXAMINATION:    BP 138/70  Pulse 72  Resp 18  Ht 5\' 4"  (1.626 m)  Wt 148 lb (67.132 kg)  BMI 25.39 kg/m2   Wt Readings from Last 3 Encounters:  11/03/13 148 lb (67.132 kg)  07/27/13 148 lb (67.132 kg)  10/11/12 140 lb (63.504 kg)     Ht Readings from Last 3 Encounters:  11/03/13 5\' 4"  (1.626 m)  10/11/12 5' 3.5" (1.613 m)  04/27/11 5\' 3"  (1.6 m)    General appearance: alert, cooperative and appears stated age Head: Normocephalic, without obvious abnormality, atraumatic Neck: no adenopathy, supple, symmetrical, trachea midline and thyroid not enlarged, symmetric, no tenderness/mass/nodules Lungs: clear to auscultation bilaterally Breasts: Scar of the righ nipple area, Inspection  negative, No nipple retraction or dimpling, No nipple discharge or bleeding, No axillary or supraclavicular adenopathy, Normal to palpation without dominant masses Heart: regular rate and rhythm Abdomen: soft, non-tender; no masses,  no organomegaly Extremities: extremities normal, atraumatic, no cyanosis or edema Skin: Skin color, texture, turgor normal. No rashes or lesions Lymph nodes: Cervical, supraclavicular, and axillary nodes normal. No abnormal inguinal nodes palpated Neurologic: Grossly normal  Pelvic: External genitalia:  no lesions              Urethra:  normal appearing urethra with no masses, tenderness or lesions              Bartholins and Skenes: normal                 Vagina: normal appearing vagina with normal color and discharge, no lesions              Cervix: absent              Pap and high risk HPV testing done: no.            Bimanual Exam:  Uterus:   absent                                      Adnexa: normal adnexa in size, nontender and no masses                                      Rectovaginal: Confirms                                      Anus:  normal sphincter tone, no lesions  ASSESSMENT  Normal gynecologic exam. Status post TVH.  Menopausal symptoms - tolerable. Taking herbal therapy.  Recent incomplete imaging on right mammogram.  Mild stress incontinence.  Fecal incontinence.   PLAN  Mammogram recommended yearly. Patient told she may bet a call back regarding her recent mammogram.  Pap smear and high risk HPV testing not performed.  Counseled on self breast exam, Calcium and vitamin D intake, exercise. Kegel's.  Metamucil.  Lipid profile, CMP, CBC, TSH. Colonoscopy recommended.  Return annually or prn   An After Visit Summary was printed and given to the patient.

## 2013-11-03 NOTE — Patient Instructions (Signed)

## 2013-11-04 LAB — COMPREHENSIVE METABOLIC PANEL
ALBUMIN: 4.3 g/dL (ref 3.5–5.2)
ALT: 13 U/L (ref 0–35)
AST: 16 U/L (ref 0–37)
Alkaline Phosphatase: 68 U/L (ref 39–117)
BUN: 17 mg/dL (ref 6–23)
CALCIUM: 9.6 mg/dL (ref 8.4–10.5)
CHLORIDE: 101 meq/L (ref 96–112)
CO2: 30 meq/L (ref 19–32)
CREATININE: 0.69 mg/dL (ref 0.50–1.10)
GLUCOSE: 76 mg/dL (ref 70–99)
POTASSIUM: 3.8 meq/L (ref 3.5–5.3)
Sodium: 138 mEq/L (ref 135–145)
Total Bilirubin: 0.3 mg/dL (ref 0.2–1.2)
Total Protein: 6.9 g/dL (ref 6.0–8.3)

## 2013-11-04 LAB — LIPID PANEL
CHOLESTEROL: 226 mg/dL — AB (ref 0–200)
HDL: 46 mg/dL (ref >39)
LDL Cholesterol: 123 mg/dL — ABNORMAL HIGH (ref 0–99)
TRIGLYCERIDES: 285 mg/dL — AB (ref <150)
Total CHOL/HDL Ratio: 4.9 Ratio
VLDL: 57 mg/dL — AB (ref 0–40)

## 2013-11-04 LAB — TSH: TSH: 0.901 u[IU]/mL (ref 0.350–4.500)

## 2013-11-06 ENCOUNTER — Telehealth: Payer: Self-pay | Admitting: *Deleted

## 2013-11-06 NOTE — Telephone Encounter (Signed)
Message copied by Alfonzo Feller on Mon Nov 06, 2013  2:15 PM ------      Message from: [place], BROOK E      Created: Sun Nov 05, 2013  4:49 PM       Please report labs to patient.             Normal CBC, CMP, and TSH.             Elevated cholesterol and triglycerides.      I recommend a low fat, low cholesterol diet and beginning a regular exercise program.       I will ask the patient to follow up with her PCP, Dr. Dema Severin.             ------

## 2013-11-06 NOTE — Telephone Encounter (Signed)
Left Message To Call Back  

## 2013-11-06 NOTE — Telephone Encounter (Signed)
Patient notified see result note 

## 2013-11-07 ENCOUNTER — Other Ambulatory Visit: Payer: Self-pay | Admitting: Obstetrics and Gynecology

## 2013-11-07 DIAGNOSIS — R928 Other abnormal and inconclusive findings on diagnostic imaging of breast: Secondary | ICD-10-CM

## 2013-11-15 ENCOUNTER — Ambulatory Visit
Admission: RE | Admit: 2013-11-15 | Discharge: 2013-11-15 | Disposition: A | Discharge status: Home / Self Care | Payer: BC Managed Care – PPO | Source: Ambulatory Visit | Attending: Obstetrics and Gynecology | Admitting: Obstetrics and Gynecology

## 2013-11-15 DIAGNOSIS — R928 Other abnormal and inconclusive findings on diagnostic imaging of breast: Secondary | ICD-10-CM

## 2014-01-19 ENCOUNTER — Encounter: Payer: Self-pay | Admitting: Obstetrics and Gynecology

## 2014-03-09 ENCOUNTER — Other Ambulatory Visit: Payer: Self-pay

## 2014-03-26 ENCOUNTER — Encounter: Payer: Self-pay | Admitting: Obstetrics and Gynecology

## 2014-05-25 DIAGNOSIS — E559 Vitamin D deficiency, unspecified: Secondary | ICD-10-CM

## 2014-05-25 HISTORY — DX: Vitamin D deficiency, unspecified: E55.9

## 2014-06-04 ENCOUNTER — Encounter: Payer: Self-pay | Admitting: Gastroenterology

## 2014-09-10 ENCOUNTER — Telehealth: Payer: Self-pay | Admitting: Obstetrics and Gynecology

## 2014-09-10 NOTE — Telephone Encounter (Signed)
Left patient a message to call back to reschedule a future appointment that was cancelled by the provider. °

## 2014-11-08 ENCOUNTER — Ambulatory Visit: Payer: BC Managed Care – PPO | Admitting: Obstetrics and Gynecology

## 2014-11-21 ENCOUNTER — Other Ambulatory Visit: Payer: Self-pay

## 2014-11-21 DIAGNOSIS — Z1231 Encounter for screening mammogram for malignant neoplasm of breast: Secondary | ICD-10-CM

## 2014-11-23 ENCOUNTER — Ambulatory Visit (INDEPENDENT_AMBULATORY_CARE_PROVIDER_SITE_OTHER): Payer: BLUE CROSS/BLUE SHIELD | Admitting: Obstetrics and Gynecology

## 2014-11-23 ENCOUNTER — Encounter: Payer: Self-pay | Admitting: Obstetrics and Gynecology

## 2014-11-23 VITALS — BP 118/70 | HR 70 | Resp 14 | Ht 63.0 in | Wt 144.8 lb

## 2014-11-23 DIAGNOSIS — Z01419 Encounter for gynecological examination (general) (routine) without abnormal findings: Secondary | ICD-10-CM | POA: Diagnosis not present

## 2014-11-23 DIAGNOSIS — Z0000 Encounter for general adult medical examination without abnormal findings: Secondary | ICD-10-CM | POA: Diagnosis not present

## 2014-11-23 DIAGNOSIS — R3915 Urgency of urination: Secondary | ICD-10-CM

## 2014-11-23 LAB — POCT URINALYSIS DIPSTICK
Bilirubin, UA: NEGATIVE
Blood, UA: NEGATIVE
CLARITY UA: NEGATIVE
Glucose, UA: NEGATIVE
Ketones, UA: NEGATIVE
Leukocytes, UA: NEGATIVE
Nitrite, UA: NEGATIVE
Protein, UA: NEGATIVE
UROBILINOGEN UA: NEGATIVE
pH, UA: 5

## 2014-11-23 NOTE — Patient Instructions (Signed)

## 2014-11-23 NOTE — Progress Notes (Signed)
Patient ID: Denise Gibson, female   DOB: [birth year], 54 y.o.   MRN: [id number] 54 y.o. G33P1001 Married Caucasian female here for annual exam.    Some urinary frequency, night time urination and loss of urine.  NF - 1 - 4 times per night.  Some urgency to void.  Cannot hold her urine.  Caffeine use - 2 -3 caffeine beverages per day.   Hx of right breat intraductal papilloma excision. No nipple bleeding.   Some muscular and joint pain.  Saw orthopedist for elbow pain.   Hot flashes are lessening.   Painting house and remodeling.   PCP:  Carol Ada, MD  No LMP recorded (exact date). Patient has had a hysterectomy.          Sexually active: Yes.   female The current method of family planning is status post hysterectomy.    Exercising: No.  none. Smoker:  no  Health Maintenance: Pap:  Approximately 2005--normal History of abnormal Pap:  no MMG:  11-02-13 Density Cat.C, Poss. Rt.Br.mass.  Pt. Had Rt.Diag.mmg and [place] 11-15-13 and revealed Rt.br.simple cysts/BiRads 2 and rec. Screening in 1 yr.:[facility]. Has appt 11-28-14  Colonoscopy:  07/2014 benign polyps with Dr. Laurence Spates.  Pt. States next one due 07/2024. BMD:   n/a  Result  n/a TDaP:  05/2008 Screening Labs:  Hb today: 12.9, Urine today: Neg   reports that she has never smoked. She has never used smokeless tobacco. She reports that she does not drink alcohol or use illicit drugs.  Past Medical History  Diagnosis Date  . Weight loss, unintentional   . Diarrhea   . IBS (irritable bowel syndrome)   . Breast cyst october 2012    rigth breast  . Tendinitis     Rt arm 2011, Lt arm 01/2012    Past Surgical History  Procedure Laterality Date  . Cholecystectomy  03/25/11    Laparoscopy  . Vaginal hysterectomy  1998    DUB  . Laparoscopic cholecystectomy  2002  . Breast surgery Right 03/09/11    exc of right breast duct system   . Breast cyst aspiration Left 2011    5cc    Current Outpatient Prescriptions   Medication Sig Dispense Refill  . Acetaminophen (TYLENOL PO) Take by mouth as needed.    . Calcium Carbonate-Vitamin D (CALCIUM 600+D PO) Take by mouth daily.     Marland Kitchen Fexofenadine HCl (ALLEGRA PO) Take by mouth as needed.      . Ranitidine HCl (RANITIDINE ACID REDUCER PO) Take by mouth as needed.      No current facility-administered medications for this visit.    Family History  Problem Relation Age of Onset  . Hypertension Mother   . COPD Mother   . Fibromyalgia Mother   . Hyperlipidemia Father   . Breast cancer Maternal Grandmother     ROS:  Pertinent items are noted in HPI.  Otherwise, a comprehensive ROS was negative.  Exam:   BP 118/70 mmHg  Pulse 70  Resp 14  Ht 5\' 3"  (1.6 m)  Wt 144 lb 12.8 oz (65.681 kg)  BMI 25.66 kg/m2  LMP  (Exact Date)    General appearance: alert, cooperative and appears stated age Head: Normocephalic, without obvious abnormality, atraumatic Neck: no adenopathy, supple, symmetrical, trachea midline and thyroid normal to inspection and palpation Lungs: clear to auscultation bilaterally Breasts: normal appearance, no masses or tenderness, Inspection negative, No nipple retraction or dimpling, No nipple discharge or bleeding,  No axillary or supraclavicular adenopathy Heart: regular rate and rhythm Abdomen: soft, non-tender; bowel sounds normal; no masses,  no organomegaly Extremities: extremities normal, atraumatic, no cyanosis or edema Skin: Skin color, texture, turgor normal. No rashes or lesions Lymph nodes: Cervical, supraclavicular, and axillary nodes normal. No abnormal inguinal nodes palpated Neurologic: Grossly normal  Pelvic: External genitalia:  no lesions              Urethra:  normal appearing urethra with no masses, tenderness or lesions              Bartholins and Skenes: normal                 Vagina: normal appearing vagina with normal color and discharge, no lesions              Cervix: absent              Pap taken:  No. Bimanual Exam:  Uterus:  uterus absent              Adnexa: normal adnexa and no mass, fullness, tenderness              Rectovaginal: Yes.  .  Confirms.              Anus:  normal sphincter tone, no lesions  Chaperone was present for exam.  Assessment:   Well woman visit with normal exam. Status post TVH. Urgency and frequency.  Caffeine use.  Plan: Yearly mammogram recommended after age 41.  Recommended self breast exam.  Pap and HR HPV as above. Discussed Calcium, Vitamin D, regular exercise program including cardiovascular and weight bearing exercise. Labs performed.  Yes.  .   See orders. Refills given on medications.  No..  Discontinue caffeine use.  If urgency and frequency persist, office visit to discuss medical therapy and physical therapy.  Follow up annually and prn.     Additional counseling given __________________.  After visit summary provided.

## 2014-11-24 LAB — COMPREHENSIVE METABOLIC PANEL
ALK PHOS: 73 U/L (ref 39–117)
ALT: 11 U/L (ref 0–35)
AST: 14 U/L (ref 0–37)
Albumin: 4.2 g/dL (ref 3.5–5.2)
BUN: 14 mg/dL (ref 6–23)
CALCIUM: 8.7 mg/dL (ref 8.4–10.5)
CO2: 27 mEq/L (ref 19–32)
CREATININE: 0.7 mg/dL (ref 0.50–1.10)
Chloride: 100 mEq/L (ref 96–112)
Glucose, Bld: 86 mg/dL (ref 70–99)
POTASSIUM: 3.8 meq/L (ref 3.5–5.3)
SODIUM: 138 meq/L (ref 135–145)
TOTAL PROTEIN: 7.2 g/dL (ref 6.0–8.3)
Total Bilirubin: 0.6 mg/dL (ref 0.2–1.2)

## 2014-11-24 LAB — CBC
HEMATOCRIT: 40.3 % (ref 36.0–46.0)
Hemoglobin: 13.1 g/dL (ref 12.0–15.0)
MCH: 28.7 pg (ref 26.0–34.0)
MCHC: 32.5 g/dL (ref 30.0–36.0)
MCV: 88.2 fL (ref 78.0–100.0)
MPV: 10.6 fL (ref 8.6–12.4)
PLATELETS: 245 10*3/uL (ref 150–400)
RBC: 4.57 MIL/uL (ref 3.87–5.11)
RDW: 13.5 % (ref 11.5–15.5)
WBC: 6 10*3/uL (ref 4.0–10.5)

## 2014-11-24 LAB — LIPID PANEL
CHOL/HDL RATIO: 5.6 ratio
Cholesterol: 218 mg/dL — ABNORMAL HIGH (ref 0–200)
HDL: 39 mg/dL — ABNORMAL LOW (ref >=46)
LDL Cholesterol: 104 mg/dL — ABNORMAL HIGH (ref 0–99)
Triglycerides: 376 mg/dL — ABNORMAL HIGH (ref <150)
VLDL: 75 mg/dL — AB (ref 0–40)

## 2014-11-24 LAB — VITAMIN D 25 HYDROXY (VIT D DEFICIENCY, FRACTURES): Vit D, 25-Hydroxy: 20 ng/mL — ABNORMAL LOW (ref 30–100)

## 2014-11-24 LAB — TSH: TSH: 1.19 u[IU]/mL (ref 0.350–4.500)

## 2014-11-26 ENCOUNTER — Other Ambulatory Visit: Payer: Self-pay | Admitting: Obstetrics and Gynecology

## 2014-11-26 DIAGNOSIS — R7989 Other specified abnormal findings of blood chemistry: Secondary | ICD-10-CM

## 2014-11-26 MED ORDER — VITAMIN D (ERGOCALCIFEROL) 1.25 MG (50000 UNIT) PO CAPS: 50000.0000 [IU] | ORAL_CAPSULE | ORAL | Status: DC

## 2014-11-28 ENCOUNTER — Ambulatory Visit
Admission: RE | Admit: 2014-11-28 | Discharge: 2014-11-28 | Disposition: A | Discharge status: Home / Self Care | Payer: BLUE CROSS/BLUE SHIELD | Source: Ambulatory Visit

## 2014-11-28 DIAGNOSIS — Z1231 Encounter for screening mammogram for malignant neoplasm of breast: Secondary | ICD-10-CM

## 2014-12-12 ENCOUNTER — Telehealth: Payer: Self-pay

## 2014-12-12 NOTE — Telephone Encounter (Signed)
Called patient at [phone] to discuss lab results, LMOVM to call me back(pt. Has not reviewed My Chart results).

## 2014-12-12 NOTE — Telephone Encounter (Signed)
-----   Message from Nunzio Cobbs, MD sent at 11/26/2014 11:02 AM EDT ----- Results to patient through My Chart. Please call to be sure she has received the results.  Has high cholesterol and triglycerides.  I recommended diet and exercise and follow up with PCP.  Has low Vit D.  I sent in Rx for Vit D 50,000 IU every 2 weeks for 3 months.  Will recheck here in 3 weeks.  All other labs normal.

## 2014-12-13 NOTE — Telephone Encounter (Signed)
See results note. 

## 2015-02-21 LAB — HEMOGLOBIN, FINGERSTICK: Hemoglobin, fingerstick: 12.9 g/dL (ref 12.0–16.0)

## 2015-04-03 ENCOUNTER — Telehealth: Payer: Self-pay

## 2015-04-03 NOTE — Telephone Encounter (Signed)
-----   Message from Nunzio Cobbs, MD sent at 04/03/2015 11:35 AM EST ----- Regarding: please call to schedule lab recheck of vit D Pleas contact patient regarding need to follow up on her vit D level following treatment.   She came up in my EPIC reminders.   Thanks,   Brook  ----- Message -----    From: SYSTEM    Sent: 04/03/2015  12:04 AM      To: Nunzio Cobbs, MD

## 2015-04-03 NOTE — Telephone Encounter (Signed)
Left message to call Sanel Stemmer at [phone]. 

## 2015-04-09 NOTE — Telephone Encounter (Signed)
Left message to call Kaitlyn at [phone]. 

## 2015-04-09 NOTE — Telephone Encounter (Signed)
Spoke with patient. Patient would like to schedule Vitamin D recheck at this time. Lab appointment scheduled for 11/17 at 3 pm. Agreeable to date and time.  Routing to provider for final review. Patient agreeable to disposition. Will close encounter.

## 2015-04-11 ENCOUNTER — Other Ambulatory Visit (INDEPENDENT_AMBULATORY_CARE_PROVIDER_SITE_OTHER): Payer: BLUE CROSS/BLUE SHIELD

## 2015-04-11 DIAGNOSIS — R7989 Other specified abnormal findings of blood chemistry: Secondary | ICD-10-CM

## 2015-04-12 ENCOUNTER — Encounter: Payer: Self-pay | Admitting: Obstetrics and Gynecology

## 2015-04-12 ENCOUNTER — Telehealth: Payer: Self-pay | Admitting: Obstetrics and Gynecology

## 2015-04-12 ENCOUNTER — Other Ambulatory Visit: Payer: Self-pay | Admitting: Obstetrics and Gynecology

## 2015-04-12 DIAGNOSIS — E559 Vitamin D deficiency, unspecified: Secondary | ICD-10-CM

## 2015-04-12 LAB — VITAMIN D 25 HYDROXY (VIT D DEFICIENCY, FRACTURES): VIT D 25 HYDROXY: 27 ng/mL — AB (ref 30–100)

## 2015-04-12 MED ORDER — VITAMIN D (ERGOCALCIFEROL) 1.25 MG (50000 UNIT) PO CAPS: 50000.0000 [IU] | ORAL_CAPSULE | ORAL | Status: DC

## 2015-04-12 NOTE — Telephone Encounter (Signed)
Spoke with patient. Advised of message as seen below from [place]. Patient is agreeable and verbalizes understanding. 4 month lab recheck scheduled for 08/09/2014 at 1:30 pm. Agreeable to date and time.  Routing to provider for final review. Patient agreeable to disposition. Will close encounter.

## 2015-04-12 NOTE — Telephone Encounter (Signed)
Patient is calling for recent lab results °

## 2015-04-12 NOTE — Telephone Encounter (Signed)
Left message to call Bentley at [phone].  Notes Recorded by Nunzio Cobbs, MD on 04/12/2015 at 6:54 AM Results to patient through My Chart. Will start vit D 50,000 units weekly. I will send Rx to pharmacy.  Needs lab recheck in 4 months. I have already communicated this.

## 2015-05-26 DIAGNOSIS — E785 Hyperlipidemia, unspecified: Secondary | ICD-10-CM

## 2015-05-26 HISTORY — DX: Hyperlipidemia, unspecified: E78.5

## 2015-08-09 ENCOUNTER — Other Ambulatory Visit: Payer: BLUE CROSS/BLUE SHIELD

## 2015-08-12 ENCOUNTER — Other Ambulatory Visit: Payer: BLUE CROSS/BLUE SHIELD

## 2015-08-12 DIAGNOSIS — E559 Vitamin D deficiency, unspecified: Secondary | ICD-10-CM

## 2015-08-13 DIAGNOSIS — E559 Vitamin D deficiency, unspecified: Secondary | ICD-10-CM

## 2015-08-13 LAB — VITAMIN D 25 HYDROXY (VIT D DEFICIENCY, FRACTURES): VIT D 25 HYDROXY: 31 ng/mL (ref 30–100)

## 2015-09-02 DIAGNOSIS — R05 Cough: Secondary | ICD-10-CM | POA: Diagnosis not present

## 2015-10-23 ENCOUNTER — Other Ambulatory Visit: Payer: Self-pay | Admitting: Obstetrics and Gynecology

## 2015-10-23 DIAGNOSIS — Z1239 Encounter for other screening for malignant neoplasm of breast: Secondary | ICD-10-CM

## 2015-12-05 ENCOUNTER — Ambulatory Visit
Admission: RE | Admit: 2015-12-05 | Discharge: 2015-12-05 | Disposition: A | Discharge status: Home / Self Care | Payer: BLUE CROSS/BLUE SHIELD | Source: Ambulatory Visit | Attending: Obstetrics and Gynecology | Admitting: Obstetrics and Gynecology

## 2015-12-05 DIAGNOSIS — D235 Other benign neoplasm of skin of trunk: Secondary | ICD-10-CM | POA: Diagnosis not present

## 2015-12-05 DIAGNOSIS — L814 Other melanin hyperpigmentation: Secondary | ICD-10-CM | POA: Diagnosis not present

## 2015-12-05 DIAGNOSIS — L821 Other seborrheic keratosis: Secondary | ICD-10-CM | POA: Diagnosis not present

## 2015-12-05 DIAGNOSIS — Z1231 Encounter for screening mammogram for malignant neoplasm of breast: Secondary | ICD-10-CM | POA: Diagnosis not present

## 2015-12-05 DIAGNOSIS — Z1239 Encounter for other screening for malignant neoplasm of breast: Secondary | ICD-10-CM

## 2015-12-12 ENCOUNTER — Ambulatory Visit (INDEPENDENT_AMBULATORY_CARE_PROVIDER_SITE_OTHER): Payer: BLUE CROSS/BLUE SHIELD | Admitting: Obstetrics and Gynecology

## 2015-12-12 ENCOUNTER — Encounter: Payer: Self-pay | Admitting: Obstetrics and Gynecology

## 2015-12-12 VITALS — BP 126/74 | HR 70 | Resp 18 | Ht 63.25 in | Wt 155.2 lb

## 2015-12-12 DIAGNOSIS — Z113 Encounter for screening for infections with a predominantly sexual mode of transmission: Secondary | ICD-10-CM

## 2015-12-12 DIAGNOSIS — Z0000 Encounter for general adult medical examination without abnormal findings: Secondary | ICD-10-CM | POA: Diagnosis not present

## 2015-12-12 DIAGNOSIS — Z01419 Encounter for gynecological examination (general) (routine) without abnormal findings: Secondary | ICD-10-CM

## 2015-12-12 LAB — POCT URINALYSIS DIPSTICK
Bilirubin, UA: NEGATIVE
Glucose, UA: NEGATIVE
Ketones, UA: NEGATIVE
LEUKOCYTES UA: NEGATIVE
NITRITE UA: NEGATIVE
PH UA: 5
PROTEIN UA: NEGATIVE
RBC UA: NEGATIVE
UROBILINOGEN UA: NEGATIVE

## 2015-12-12 LAB — TSH: TSH: 1.94 m[IU]/L

## 2015-12-12 LAB — LIPID PANEL
Cholesterol: 262 mg/dL — ABNORMAL HIGH (ref 125–200)
HDL: 38 mg/dL — ABNORMAL LOW (ref >=46)
TRIGLYCERIDES: 492 mg/dL — AB (ref <150)
Total CHOL/HDL Ratio: 6.9 Ratio — ABNORMAL HIGH (ref <=5.0)

## 2015-12-12 LAB — COMPREHENSIVE METABOLIC PANEL
ALBUMIN: 4.2 g/dL (ref 3.6–5.1)
ALT: 14 U/L (ref 6–29)
AST: 16 U/L (ref 10–35)
Alkaline Phosphatase: 73 U/L (ref 33–130)
BILIRUBIN TOTAL: 0.3 mg/dL (ref 0.2–1.2)
BUN: 15 mg/dL (ref 7–25)
CALCIUM: 10.2 mg/dL (ref 8.6–10.4)
CHLORIDE: 100 mmol/L (ref 98–110)
CO2: 26 mmol/L (ref 20–31)
CREATININE: 0.8 mg/dL (ref 0.50–1.05)
Glucose, Bld: 95 mg/dL (ref 65–99)
Potassium: 4 mmol/L (ref 3.5–5.3)
Sodium: 138 mmol/L (ref 135–146)
Total Protein: 7.5 g/dL (ref 6.1–8.1)

## 2015-12-12 LAB — CBC
HEMATOCRIT: 41.4 % (ref 35.0–45.0)
HEMOGLOBIN: 13.6 g/dL (ref 11.7–15.5)
MCH: 28.6 pg (ref 27.0–33.0)
MCHC: 32.9 g/dL (ref 32.0–36.0)
MCV: 87 fL (ref 80.0–100.0)
MPV: 11.1 fL (ref 7.5–12.5)
Platelets: 308 10*3/uL (ref 140–400)
RBC: 4.76 MIL/uL (ref 3.80–5.10)
RDW: 13.2 % (ref 11.0–15.0)
WBC: 5.2 10*3/uL (ref 3.8–10.8)

## 2015-12-12 NOTE — Patient Instructions (Signed)
Health Maintenance, Female Adopting a healthy lifestyle and getting preventive care can go a long way to promote health and wellness. Talk with your health care provider about what schedule of regular examinations is right for you. This is a good chance for you to check in with your provider about disease prevention and staying healthy. In between checkups, there are plenty of things you can do on your own. Experts have done a lot of research about which lifestyle changes and preventive measures are most likely to keep you healthy. Ask your health care provider for more information. WEIGHT AND DIET  Eat a healthy diet  Be sure to include plenty of vegetables, fruits, low-fat dairy products, and lean protein.  Do not eat a lot of foods high in solid fats, added sugars, or salt.  Get regular exercise. This is one of the most important things you can do for your health.  Most adults should exercise for at least 150 minutes each week. The exercise should increase your heart rate and make you sweat (moderate-intensity exercise).  Most adults should also do strengthening exercises at least twice a week. This is in addition to the moderate-intensity exercise.  Maintain a healthy weight  Body mass index (BMI) is a measurement that can be used to identify possible weight problems. It estimates body fat based on height and weight. Your health care provider can help determine your BMI and help you achieve or maintain a healthy weight.  For females 20 years of age and older:   A BMI below 18.5 is considered underweight.  A BMI of 18.5 to 24.9 is normal.  A BMI of 25 to 29.9 is considered overweight.  A BMI of 30 and above is considered obese.  Watch levels of cholesterol and blood lipids  You should start having your blood tested for lipids and cholesterol at 55 years of age, then have this test every 5 years.  You may need to have your cholesterol levels checked more often if:  Your lipid  or cholesterol levels are high.  You are older than 55 years of age.  You are at high risk for heart disease.  CANCER SCREENING   Lung Cancer  Lung cancer screening is recommended for adults 55-80 years old who are at high risk for lung cancer because of a history of smoking.  A yearly low-dose CT scan of the lungs is recommended for people who:  Currently smoke.  Have quit within the past 15 years.  Have at least a 30-pack-year history of smoking. A pack year is smoking an average of one pack of cigarettes a day for 1 year.  Yearly screening should continue until it has been 15 years since you quit.  Yearly screening should stop if you develop a health problem that would prevent you from having lung cancer treatment.  Breast Cancer  Practice breast self-awareness. This means understanding how your breasts normally appear and feel.  It also means doing regular breast self-exams. Let your health care provider know about any changes, no matter how small.  If you are in your 20s or 30s, you should have a clinical breast exam (CBE) by a health care provider every 1-3 years as part of a regular health exam.  If you are 40 or older, have a CBE every year. Also consider having a breast X-ray (mammogram) every year.  If you have a family history of breast cancer, talk to your health care provider about genetic screening.  If you   are at high risk for breast cancer, talk to your health care provider about having an MRI and a mammogram every year.  Breast cancer gene (BRCA) assessment is recommended for women who have family members with BRCA-related cancers. BRCA-related cancers include:  Breast.  Ovarian.  Tubal.  Peritoneal cancers.  Results of the assessment will determine the need for genetic counseling and BRCA1 and BRCA2 testing. Cervical Cancer Your health care provider may recommend that you be screened regularly for cancer of the pelvic organs (ovaries, uterus, and  vagina). This screening involves a pelvic examination, including checking for microscopic changes to the surface of your cervix (Pap test). You may be encouraged to have this screening done every 3 years, beginning at age 21.  For women ages 30-65, health care providers may recommend pelvic exams and Pap testing every 3 years, or they may recommend the Pap and pelvic exam, combined with testing for human papilloma virus (HPV), every 5 years. Some types of HPV increase your risk of cervical cancer. Testing for HPV may also be done on women of any age with unclear Pap test results.  Other health care providers may not recommend any screening for nonpregnant women who are considered low risk for pelvic cancer and who do not have symptoms. Ask your health care provider if a screening pelvic exam is right for you.  If you have had past treatment for cervical cancer or a condition that could lead to cancer, you need Pap tests and screening for cancer for at least 20 years after your treatment. If Pap tests have been discontinued, your risk factors (such as having a new sexual partner) need to be reassessed to determine if screening should resume. Some women have medical problems that increase the chance of getting cervical cancer. In these cases, your health care provider may recommend more frequent screening and Pap tests. Colorectal Cancer  This type of cancer can be detected and often prevented.  Routine colorectal cancer screening usually begins at 55 years of age and continues through 55 years of age.  Your health care provider may recommend screening at an earlier age if you have risk factors for colon cancer.  Your health care provider may also recommend using home test kits to check for hidden blood in the stool.  A small camera at the end of a tube can be used to examine your colon directly (sigmoidoscopy or colonoscopy). This is done to check for the earliest forms of colorectal  cancer.  Routine screening usually begins at age 50.  Direct examination of the colon should be repeated every 5-10 years through 55 years of age. However, you may need to be screened more often if early forms of precancerous polyps or small growths are found. Skin Cancer  Check your skin from head to toe regularly.  Tell your health care provider about any new moles or changes in moles, especially if there is a change in a mole's shape or color.  Also tell your health care provider if you have a mole that is larger than the size of a pencil eraser.  Always use sunscreen. Apply sunscreen liberally and repeatedly throughout the day.  Protect yourself by wearing long sleeves, pants, a wide-brimmed hat, and sunglasses whenever you are outside. HEART DISEASE, DIABETES, AND HIGH BLOOD PRESSURE   High blood pressure causes heart disease and increases the risk of stroke. High blood pressure is more likely to develop in:  People who have blood pressure in the high end   of the normal range (130-139/85-89 mm Hg).  People who are overweight or obese.  People who are African American.  If you are 38-23 years of age, have your blood pressure checked every 3-5 years. If you are 61 years of age or older, have your blood pressure checked every year. You should have your blood pressure measured twice--once when you are at a hospital or clinic, and once when you are not at a hospital or clinic. Record the average of the two measurements. To check your blood pressure when you are not at a hospital or clinic, you can use:  An automated blood pressure machine at a pharmacy.  A home blood pressure monitor.  If you are between 45 years and 39 years old, ask your health care provider if you should take aspirin to prevent strokes.  Have regular diabetes screenings. This involves taking a blood sample to check your fasting blood sugar level.  If you are at a normal weight and have a low risk for diabetes,  have this test once every three years after 55 years of age.  If you are overweight and have a high risk for diabetes, consider being tested at a younger age or more often. PREVENTING INFECTION  Hepatitis B  If you have a higher risk for hepatitis B, you should be screened for this virus. You are considered at high risk for hepatitis B if:  You were born in a country where hepatitis B is common. Ask your health care provider which countries are considered high risk.  Your parents were born in a high-risk country, and you have not been immunized against hepatitis B (hepatitis B vaccine).  You have HIV or AIDS.  You use needles to inject street drugs.  You live with someone who has hepatitis B.  You have had sex with someone who has hepatitis B.  You get hemodialysis treatment.  You take certain medicines for conditions, including cancer, organ transplantation, and autoimmune conditions. Hepatitis C  Blood testing is recommended for:  Everyone born from [birth year] through 1965.  Anyone with known risk factors for hepatitis C. Sexually transmitted infections (STIs)  You should be screened for sexually transmitted infections (STIs) including gonorrhea and chlamydia if:  You are sexually active and are younger than 55 years of age.  You are older than 55 years of age and your health care provider tells you that you are at risk for this type of infection.  Your sexual activity has changed since you were last screened and you are at an increased risk for chlamydia or gonorrhea. Ask your health care provider if you are at risk.  If you do not have HIV, but are at risk, it may be recommended that you take a prescription medicine daily to prevent HIV infection. This is called pre-exposure prophylaxis (PrEP). You are considered at risk if:  You are sexually active and do not regularly use condoms or know the HIV status of your partner(s).  You take drugs by injection.  You are sexually  active with a partner who has HIV. Talk with your health care provider about whether you are at high risk of being infected with HIV. If you choose to begin PrEP, you should first be tested for HIV. You should then be tested every 3 months for as long as you are taking PrEP.  PREGNANCY   If you are premenopausal and you may become pregnant, ask your health care provider about preconception counseling.  If you may  become pregnant, take 400 to 800 micrograms (mcg) of folic acid every day.  If you want to prevent pregnancy, talk to your health care provider about birth control (contraception). OSTEOPOROSIS AND MENOPAUSE   Osteoporosis is a disease in which the bones lose minerals and strength with aging. This can result in serious bone fractures. Your risk for osteoporosis can be identified using a bone density scan.  If you are 61 years of age or older, or if you are at risk for osteoporosis and fractures, ask your health care provider if you should be screened.  Ask your health care provider whether you should take a calcium or vitamin D supplement to lower your risk for osteoporosis.  Menopause may have certain physical symptoms and risks.  Hormone replacement therapy may reduce some of these symptoms and risks. Talk to your health care provider about whether hormone replacement therapy is right for you.  HOME CARE INSTRUCTIONS   Schedule regular health, dental, and eye exams.  Stay current with your immunizations.   Do not use any tobacco products including cigarettes, chewing tobacco, or electronic cigarettes.  If you are pregnant, do not drink alcohol.  If you are breastfeeding, limit how much and how often you drink alcohol.  Limit alcohol intake to no more than 1 drink per day for nonpregnant women. One drink equals 12 ounces of beer, 5 ounces of wine, or 1 ounces of hard liquor.  Do not use street drugs.  Do not share needles.  Ask your health care provider for help if  you need support or information about quitting drugs.  Tell your health care provider if you often feel depressed.  Tell your health care provider if you have ever been abused or do not feel safe at home.   This information is not intended to replace advice given to you by your health care provider. Make sure you discuss any questions you have with your health care provider.   Document Released: 11/24/2010 Document Revised: 06/01/2014 Document Reviewed: 04/12/2013 Elsevier Interactive Patient Education [employer].

## 2015-12-12 NOTE — Progress Notes (Signed)
Patient ID: Denise Gibson, female   DOB: 09-09-1960, 55 y.o.   MRN: [id number] 55 y.o. G103P1001 Married Caucasian female here for annual exam.    Hot flashes are improved.   States she has cysts in left breast that come and go.  Some constipation.  Thinks it is from calcium.   Night time urination.  Sometimes not getting up at all at night.  Reducing caffeine intake.  Cutting down on Dr. Malachi Bonds.  Tendinitis issues in elbows.   Seeing orthopedist.  Doing cortisone injections.   PCP: Carol Ada, MD    No LMP recorded (exact date). Patient has had a hysterectomy.           Sexually active: Yes.   female The current method of family planning is status post hysterectomy--ovaries remain.    Exercising: No.    Smoker:  no  Health Maintenance: Pap:  2005 normal History of abnormal Pap:  no MMG:  12-05-15 Density C/Neg/BiRads1:[facility] Colonoscopy:  07/2014 benign polyps with Dr. Alferd [facility]);next due 07/2024. BMD:   n/a  Result  n/a TDaP:  2010 Gardasil:   N/A HIV:  Today. Hep C:  Today. Screening Labs:    Urine today: Neg   reports that she has never smoked. She has never used smokeless tobacco. She reports that she does not drink alcohol or use illicit drugs.  Past Medical History  Diagnosis Date  . Weight loss, unintentional   . Diarrhea   . IBS (irritable bowel syndrome)   . Breast cyst october 2012    rigth breast  . Tendinitis     Rt arm 2011, Lt arm 01/2012  . Vitamin D deficiency 2016    Past Surgical History  Procedure Laterality Date  . Cholecystectomy  03/25/11    Laparoscopy  . Vaginal hysterectomy  1998    DUB  . Laparoscopic cholecystectomy  2002  . Breast surgery Right 03/09/11    exc of right breast duct system   . Breast cyst aspiration Left 2011    5cc    Current Outpatient Prescriptions  Medication Sig Dispense Refill  . Acetaminophen (TYLENOL PO) Take by mouth as needed.    . Calcium Carbonate-Vitamin D (CALCIUM 600+D PO)  Take by mouth daily. Has 800mg  of vitamin D3    . Cholecalciferol (VITAMIN D3) 2000 units TABS Take 1 tablet by mouth daily.    Marland Kitchen Fexofenadine HCl (ALLEGRA PO) Take by mouth as needed.      . Ranitidine HCl (RANITIDINE ACID REDUCER PO) Take by mouth as needed.      No current facility-administered medications for this visit.    Family History  Problem Relation Age of Onset  . Hypertension Mother   . COPD Mother   . Fibromyalgia Mother   . Hyperlipidemia Father   . Breast cancer Maternal Grandmother     ROS:  Pertinent items are noted in HPI.  Otherwise, a comprehensive ROS was negative.  Exam:   BP 126/74 mmHg  Pulse 70  Resp 18  Ht 5' 3.25" (1.607 m)  Wt 155 lb 3.2 oz (70.398 kg)  BMI 27.26 kg/m2  LMP  (Exact Date)    General appearance: alert, cooperative and appears stated age Head: Normocephalic, without obvious abnormality, atraumatic Neck: no adenopathy, supple, symmetrical, trachea midline and thyroid normal to inspection and palpation Lungs: clear to auscultation bilaterally Breasts: normal appearance, no masses or tenderness, Inspection negative, No nipple retraction or dimpling, No nipple discharge or bleeding, No axillary  or supraclavicular adenopathy Heart: regular rate and rhythm Abdomen:   soft, non-tender; no masses, no organomegaly Extremities: extremities normal, atraumatic, no cyanosis or edema Skin: Skin color, texture, turgor normal. No rashes or lesions Lymph nodes: Cervical, supraclavicular, and axillary nodes normal. No abnormal inguinal nodes palpated Neurologic: Grossly normal  Pelvic: External genitalia:  no lesions              Urethra:  normal appearing urethra with no masses, tenderness or lesions              Bartholins and Skenes: normal                 Vagina: normal appearing vagina with normal color and discharge, no lesions              Cervix: absent              Pap taken: No. Bimanual Exam:  Uterus:  uterus absent               Adnexa: normal adnexa and no mass, fullness, tenderness              Rectal exam: Yes.  .  Confirms.              Anus:  normal sphincter tone, no lesions  Chaperone was present for exam.  Assessment:   Well woman visit with normal exam. Status post TVH.  Ovaries remain. Hx fibrocystic breasts.  Normal exam today.  Status post right breast ductal surgery.  PASH and intraductal papilloma.  Plan: Yearly mammogram recommended after age 36.  Recommended self breast exam.  Pap and HR HPV as above. Discussed Calcium, Vitamin D, regular exercise program including cardiovascular and weight bearing exercise. Labs performed.  Yes.  .   See orders.  Routine labs, HIV, hep C. Prescription medication(s) given.  No..    Follow up annually and prn.       After visit summary provided.

## 2015-12-13 LAB — VITAMIN D 25 HYDROXY (VIT D DEFICIENCY, FRACTURES): VIT D 25 HYDROXY: 40 ng/mL (ref 30–100)

## 2015-12-13 LAB — HEPATITIS C ANTIBODY: HCV Ab: NEGATIVE

## 2015-12-13 LAB — HIV ANTIBODY (ROUTINE TESTING W REFLEX): HIV 1&2 Ab, 4th Generation: NONREACTIVE

## 2015-12-15 ENCOUNTER — Encounter: Payer: Self-pay | Admitting: Obstetrics and Gynecology

## 2016-02-11 DIAGNOSIS — L820 Inflamed seborrheic keratosis: Secondary | ICD-10-CM | POA: Diagnosis not present

## 2016-04-03 DIAGNOSIS — J029 Acute pharyngitis, unspecified: Secondary | ICD-10-CM | POA: Diagnosis not present

## 2016-05-27 DIAGNOSIS — L719 Rosacea, unspecified: Secondary | ICD-10-CM | POA: Diagnosis not present

## 2016-07-23 ENCOUNTER — Telehealth: Payer: Self-pay | Admitting: Obstetrics and Gynecology

## 2016-07-23 ENCOUNTER — Ambulatory Visit (INDEPENDENT_AMBULATORY_CARE_PROVIDER_SITE_OTHER): Payer: BLUE CROSS/BLUE SHIELD | Admitting: Obstetrics and Gynecology

## 2016-07-23 ENCOUNTER — Encounter: Payer: Self-pay | Admitting: Obstetrics and Gynecology

## 2016-07-23 VITALS — BP 140/86 | HR 76 | Ht 63.25 in | Wt 159.0 lb

## 2016-07-23 DIAGNOSIS — N951 Menopausal and female climacteric states: Secondary | ICD-10-CM

## 2016-07-23 LAB — TSH: TSH: 0.63 mIU/L

## 2016-07-23 MED ORDER — VENLAFAXINE HCL ER 37.5 MG PO CP24: 37.5000 mg | ORAL_CAPSULE | Freq: Every day | ORAL | 2 refills | Status: DC

## 2016-07-23 NOTE — Progress Notes (Signed)
GYNECOLOGY  VISIT   HPI: 56 y.o.   Married  Caucasian  female   G1P1001 with No LMP recorded. Patient has had a hysterectomy.   here for complaint of hot flashes and insomnia.  Waking up at night.  Never feels comfortable temperature wise.  Feels hot and then cold.  Has 1 - 2 per hour.   No vaginal dryness changes.  Still occurring.   Has skin and hair dryness.  Hot flashes started in 2012.  Tired Estrace and stopped it because she felt strange an had vision changes.  Currently dealing with a flare of rosacea.  GYNECOLOGIC HISTORY: No LMP recorded. Patient has had a hysterectomy. Contraception:  Hysterectomy--ovaries remain Menopausal hormone therapy:  none Last mammogram:   12-05-15 Density C/Neg/BiRads1:[facility] Last pap smear: 2005 normal        OB History    Gravida Para Term Preterm AB Living   1 1 1     1    SAB TAB Ectopic Multiple Live Births           1         There are no active problems to display for this patient.   Past Medical History:  Diagnosis Date  . Breast cyst october 2012   rigth breast  . Diarrhea   . Hyperlipidemia 2017  . IBS (irritable bowel syndrome)   . Rosacea   . Tendinitis    Rt arm 2011, Lt arm 01/2012  . Vitamin D deficiency 2016  . Weight loss, unintentional     Past Surgical History:  Procedure Laterality Date  . BREAST CYST ASPIRATION Left 2011   5cc  . BREAST SURGERY Right 03/09/11   exc of right breast duct system   . CHOLECYSTECTOMY  03/25/11   Laparoscopy  . LAPAROSCOPIC CHOLECYSTECTOMY  2002  . VAGINAL HYSTERECTOMY  1998   DUB    Current Outpatient Prescriptions  Medication Sig Dispense Refill  . Acetaminophen (TYLENOL PO) Take by mouth as needed.    . Calcium Carbonate-Vitamin D (CALCIUM 600+D PO) Take by mouth daily. Has 800mg  of vitamin D3    . Cholecalciferol (VITAMIN D3) 2000 units TABS Take 1 tablet by mouth daily.    Marland Kitchen Fexofenadine HCl (ALLEGRA PO) Take by mouth as needed.      . ORACEA 40  MG capsule Take 1 tablet by mouth daily.    . Ranitidine HCl (RANITIDINE ACID REDUCER PO) Take by mouth as needed.      No current facility-administered medications for this visit.      ALLERGIES: Patient has no known allergies.  Family History  Problem Relation Age of Onset  . Hypertension Mother   . COPD Mother   . Fibromyalgia Mother   . Hyperlipidemia Father   . Breast cancer Maternal Grandmother     Social History   Social History  . Marital status: Married    Spouse name: N/A  . Number of children: N/A  . Years of education: N/A   Occupational History  . Not on file.   Social History Main Topics  . Smoking status: Never Smoker  . Smokeless tobacco: Never Used  . Alcohol use No  . Drug use: No  . Sexual activity: Yes    Partners: Male    Birth control/ protection: Surgical     Comment: [facility]--ovaries [phone])   Other Topics Concern  . Not on file   Social History Narrative  . No narrative on file  ROS:  Pertinent items are noted in HPI.  PHYSICAL EXAMINATION:    BP 140/86 (BP Location: Right Arm, Patient Position: Sitting, Cuff Size: Normal)   Pulse 76   Ht 5' 3.25" (1.607 m)   Wt 159 lb (72.1 kg)   BMI 27.94 kg/m     General appearance: alert, cooperative and appears stated age   Right side of face with ruddy skin color and rash.   ASSESSMENT  Menopausal symptoms.  Intolerance to oral estrogen.  Rosacea flare. Status post vaginal hysterectomy.    PLAN  Discussed menopausal changes and symptoms.  Will check TSH, FSH, and estradiol. Reviewed SSRIs, SNRIs, Gabapentin, Catapress, and herbal remedies to treat vasomotor symptoms.  Will start Effexor XR 37.5 mg daily.   Side effects reviewed - weight gain, decreased libido, serotonin syndrome. Benefits of treating anxiety, depression also reviewed. Follow up in about 10 weeks.  She know she can come in sooner if needed.  She will keep her annual exam for later this year. An After Visit  Summary was printed and given to the patient.  __15____ minutes face to face time of which over 50% was spent in counseling.

## 2016-07-23 NOTE — Telephone Encounter (Signed)
Patient called and requested a call back from the nurse after lunch. No other details left on voicemail.

## 2016-07-23 NOTE — Telephone Encounter (Signed)
Spoke with patient. Patient states hot flashes have been  kicking her butt for 2 years now. Patient states she is menopausal and reports hot flashes come and go but more consistent over last 60 days. Patient reports she is loosing sleep and wonders if related to hormones. Patient states she was last seen in July 2017 for AEX. Recommended OV for further evaluation and discuss with Dr. Quincy Simmonds, patient scheduled for today at 2:30pm. Patient is agreeable to date and time.  Routing to provider for final review. Patient is agreeable to disposition. Will close encounter.

## 2016-07-24 LAB — ESTRADIOL: ESTRADIOL: 15 pg/mL

## 2016-07-24 LAB — FOLLICLE STIMULATING HORMONE: FSH: 111.3 m[IU]/mL

## 2016-10-01 ENCOUNTER — Encounter: Payer: Self-pay | Admitting: Obstetrics and Gynecology

## 2016-10-01 ENCOUNTER — Ambulatory Visit (INDEPENDENT_AMBULATORY_CARE_PROVIDER_SITE_OTHER): Payer: BLUE CROSS/BLUE SHIELD | Admitting: Obstetrics and Gynecology

## 2016-10-01 VITALS — BP 122/82 | HR 80 | Resp 16 | Ht 63.25 in | Wt 158.0 lb

## 2016-10-01 DIAGNOSIS — R631 Polydipsia: Secondary | ICD-10-CM | POA: Diagnosis not present

## 2016-10-01 DIAGNOSIS — N951 Menopausal and female climacteric states: Secondary | ICD-10-CM

## 2016-10-01 MED ORDER — VENLAFAXINE HCL ER 75 MG PO CP24: 75.0000 mg | ORAL_CAPSULE | Freq: Every day | ORAL | 3 refills | Status: DC

## 2016-10-01 NOTE — Progress Notes (Signed)
Opened in error

## 2016-10-01 NOTE — Progress Notes (Signed)
. GYNECOLOGY  VISIT   HPI: 56 y.o.   Married  Caucasian  female   G1P1001 with No LMP recorded. Patient has had a hysterectomy.   here for  Follow up of menopausal symptoms. Pt was put on Effexor  Had previous intolerance to oral estrogen.   Now on Effexor 37.5 mg daily. Has less cold.  Sleeping better.  Takes the medication midmorning and the hot flashes are intensified.  Hands are swelling which is what occurs for her in the hot weather.   No change in her mood or affect.  TSH 0.63.  FSH 113 and E2 15.  Has increase thirst and having night time urination.   GYNECOLOGIC HISTORY: No LMP recorded. Patient has had a hysterectomy. Contraception:  hysterectomy Menopausal hormone therapy:  none Last mammogram:  12-05-15 category c density birads 1:neg Last pap smear:  2005 normal        OB History    Gravida Para Term Preterm AB Living   1 1 1     1    SAB TAB Ectopic Multiple Live Births           1         There are no active problems to display for this patient.   Past Medical History:  Diagnosis Date  . Breast cyst october 2012   rigth breast  . Diarrhea   . Hyperlipidemia 2017  . IBS (irritable bowel syndrome)   . Rosacea   . Tendinitis    Rt arm 2011, Lt arm 01/2012  . Vitamin D deficiency 2016  . Weight loss, unintentional     Past Surgical History:  Procedure Laterality Date  . BREAST CYST ASPIRATION Left 2011   5cc  . BREAST SURGERY Right 03/09/11   exc of right breast duct system   . CHOLECYSTECTOMY  03/25/11   Laparoscopy  . LAPAROSCOPIC CHOLECYSTECTOMY  2002  . VAGINAL HYSTERECTOMY  1998   DUB    Current Outpatient Prescriptions  Medication Sig Dispense Refill  . Acetaminophen (TYLENOL PO) Take by mouth as needed.    . Calcium Carbonate-Vitamin D (CALCIUM 600+D PO) Take by mouth daily. Has 800mg  of vitamin D3    . Cholecalciferol (VITAMIN D3) 2000 units TABS Take 1 tablet by mouth daily.    Marland Kitchen Fexofenadine HCl (ALLEGRA PO) Take by mouth as  needed.      . ORACEA 40 MG capsule Take 1 tablet by mouth daily.    . Ranitidine HCl (RANITIDINE ACID REDUCER PO) Take 150 mg by mouth daily.     Marland Kitchen venlafaxine XR (EFFEXOR XR) 37.5 MG 24 hr capsule Take 1 capsule (37.5 mg total) by mouth daily. 30 capsule 2   No current facility-administered medications for this visit.      ALLERGIES: Patient has no known allergies.  Family History  Problem Relation Age of Onset  . Hypertension Mother   . COPD Mother   . Fibromyalgia Mother   . Hyperlipidemia Father   . Breast cancer Maternal Grandmother     Social History   Social History  . Marital status: Married    Spouse name: N/A  . Number of children: N/A  . Years of education: N/A   Occupational History  . Not on file.   Social History Main Topics  . Smoking status: Never Smoker  . Smokeless tobacco: Never Used  . Alcohol use No  . Drug use: No  . Sexual activity: Yes    Partners: Male  Birth control/ protection: Surgical     Comment: [facility]--ovaries [phone])   Other Topics Concern  . Not on file   Social History Narrative  . No narrative on file    ROS:  Pertinent items are noted in HPI.  PHYSICAL EXAMINATION:    BP 122/82   Pulse 80   Resp 16   Ht 5' 3.25" (1.607 m)   Wt 158 lb (71.7 kg)   BMI 27.77 kg/m     General appearance: alert, cooperative and appears stated age   ASSESSMENT  Menopausal symptoms.  Somewhat improved on Effexor 37.5 mg. Increased thirst.  Nocturia.  PLAN  Will increase Effexor to 75 mg daily.   Check BMP.  If Effexor not working well, will consider Neurontin.  Follow up for annual exam in August.    An After Visit Summary was printed and given to the patient.  __15__ minutes face to face time of which over 50% was spent in counseling.

## 2016-10-02 LAB — BASIC METABOLIC PANEL WITH GFR
BUN: 17 mg/dL (ref 7–25)
CO2: 25 mmol/L (ref 20–31)
Calcium: 9.4 mg/dL (ref 8.6–10.4)
Chloride: 103 mmol/L (ref 98–110)
Creat: 0.91 mg/dL (ref 0.50–1.05)
Glucose, Bld: 86 mg/dL (ref 65–99)
Potassium: 4.2 mmol/L (ref 3.5–5.3)
Sodium: 139 mmol/L (ref 135–146)

## 2016-11-30 ENCOUNTER — Other Ambulatory Visit: Payer: Self-pay | Admitting: Obstetrics and Gynecology

## 2016-11-30 DIAGNOSIS — Z1231 Encounter for screening mammogram for malignant neoplasm of breast: Secondary | ICD-10-CM

## 2016-12-03 DIAGNOSIS — D225 Melanocytic nevi of trunk: Secondary | ICD-10-CM | POA: Diagnosis not present

## 2016-12-03 DIAGNOSIS — L821 Other seborrheic keratosis: Secondary | ICD-10-CM | POA: Diagnosis not present

## 2016-12-03 DIAGNOSIS — L814 Other melanin hyperpigmentation: Secondary | ICD-10-CM | POA: Diagnosis not present

## 2016-12-08 ENCOUNTER — Ambulatory Visit
Admission: RE | Admit: 2016-12-08 | Discharge: 2016-12-08 | Disposition: A | Discharge status: Home / Self Care | Payer: BLUE CROSS/BLUE SHIELD | Source: Ambulatory Visit | Attending: Obstetrics and Gynecology | Admitting: Obstetrics and Gynecology

## 2016-12-08 DIAGNOSIS — Z1231 Encounter for screening mammogram for malignant neoplasm of breast: Secondary | ICD-10-CM

## 2016-12-30 ENCOUNTER — Ambulatory Visit (INDEPENDENT_AMBULATORY_CARE_PROVIDER_SITE_OTHER): Payer: BLUE CROSS/BLUE SHIELD | Admitting: Obstetrics and Gynecology

## 2016-12-30 ENCOUNTER — Encounter: Payer: Self-pay | Admitting: Obstetrics and Gynecology

## 2016-12-30 VITALS — BP 128/76 | HR 88 | Resp 16 | Ht 63.25 in | Wt 156.0 lb

## 2016-12-30 DIAGNOSIS — Z01419 Encounter for gynecological examination (general) (routine) without abnormal findings: Secondary | ICD-10-CM

## 2016-12-30 MED ORDER — VENLAFAXINE HCL 37.5 MG PO TABS: 37.5000 mg | ORAL_TABLET | Freq: Every day | ORAL | 1 refills | Status: DC

## 2016-12-30 NOTE — Patient Instructions (Signed)
EXERCISE AND DIET:  We recommended that you start or continue a regular exercise program for good health. Regular exercise means any activity that makes your heart beat faster and makes you sweat.  We recommend exercising at least 30 minutes per day at least 3 days a week, preferably 4 or 5.  We also recommend a diet low in fat and sugar.  Inactivity, poor dietary choices and obesity can cause diabetes, heart attack, stroke, and kidney damage, among others.    ALCOHOL AND SMOKING:  Women should limit their alcohol intake to no more than 7 drinks/beers/glasses of wine (combined, not each!) per week. Moderation of alcohol intake to this level decreases your risk of breast cancer and liver damage. And of course, no recreational drugs are part of a healthy lifestyle.  And absolutely no smoking or even second hand smoke. Most people know smoking can cause heart and lung diseases, but did you know it also contributes to weakening of your bones? Aging of your skin?  Yellowing of your teeth and nails?  CALCIUM AND VITAMIN D:  Adequate intake of calcium and Vitamin D are recommended.  The recommendations for exact amounts of these supplements seem to change often, but generally speaking 600 mg of calcium (either carbonate or citrate) and 800 units of Vitamin D per day seems prudent. Certain women may benefit from higher intake of Vitamin D.  If you are among these women, your doctor will have told you during your visit.    PAP SMEARS:  Pap smears, to check for cervical cancer or precancers,  have traditionally been done yearly, although recent scientific advances have shown that most women can have pap smears less often.  However, every woman still should have a physical exam from her gynecologist every year. It will include a breast check, inspection of the vulva and vagina to check for abnormal growths or skin changes, a visual exam of the cervix, and then an exam to evaluate the size and shape of the uterus and  ovaries.  And after 56 years of age, a rectal exam is indicated to check for rectal cancers. We will also provide age appropriate advice regarding health maintenance, like when you should have certain vaccines, screening for sexually transmitted diseases, bone density testing, colonoscopy, mammograms, etc.   MAMMOGRAMS:  All women over 40 years old should have a yearly mammogram. Many facilities now offer a "3D" mammogram, which may cost around $50 extra out of pocket. If possible,  we recommend you accept the option to have the 3D mammogram performed.  It both reduces the number of women who will be called back for extra views which then turn out to be normal, and it is better than the routine mammogram at detecting truly abnormal areas.    COLONOSCOPY:  Colonoscopy to screen for colon cancer is recommended for all women at age 50.  We know, you hate the idea of the prep.  We agree, BUT, having colon cancer and not knowing it is worse!!  Colon cancer so often starts as a polyp that can be seen and removed at colonscopy, which can quite literally save your life!  And if your first colonoscopy is normal and you have no family history of colon cancer, most women don't have to have it again for 10 years.  Once every ten years, you can do something that may end up saving your life, right?  We will be happy to help you get it scheduled when you are ready.    Be sure to check your insurance coverage so you understand how much it will cost.  It may be covered as a preventative service at no cost, but you should check your particular policy.     Menopause and Herbal Products What is menopause? Menopause is the normal time of life when menstrual periods decrease in frequency and eventually stop completely. This process can take several years for some women. Menopause is complete when you have had an absence of menstruation for a full year since your last menstrual period. It usually occurs between the ages of 48 and  55. It is not common for menopause to begin before the age of 40. During menopause, your body stops producing the female hormones estrogen and progesterone. Common symptoms associated with this loss of hormones (vasomotor symptoms) are:  Hot flashes.  Hot flushes.  Night sweats.  Other common symptoms and complications of menopause include:  Decrease in sex drive.  Vaginal dryness and thinning of the walls of the vagina. This can make sex painful.  Dryness of the skin and development of wrinkles.  Headaches.  Tiredness.  Irritability.  Memory problems.  Weight gain.  Bladder infections.  Hair growth on the face and chest.  Inability to reproduce offspring (infertility).  Loss of density in the bones (osteoporosis) increasing your risk for breaks (fractures).  Depression.  Hardening and narrowing of the arteries (atherosclerosis). This increases your risk of heart attack and stroke.  What treatment options are available? There are many treatment choices for menopause symptoms. The most common treatment is hormone replacement therapy. Many alternative therapies for menopause are emerging, including the use of herbal products. These supplements can be found in the form of herbs, teas, oils, tinctures, and pills. Common herbal supplements for menopause are made from plants that contain phytoestrogens. Phytoestrogens are compounds that occur naturally in plants and plant products. They act like estrogen in the body. Foods and herbs that contain phytoestrogens include:  Soy.  Flax seeds.  Red clover.  Ginseng.  What menopause symptoms may be helped if I use herbal products?  Vasomotor symptoms. These may be helped by: ? Soy. Some studies show that soy may have a moderate benefit for hot flashes. ? Black cohosh. There is limited evidence indicating this may be beneficial for hot flashes.  Symptoms that are related to heart and blood vessel disease. These may be  helped by soy. Studies have shown that soy can help to lower cholesterol.  Depression. This may be helped by: ? St. John's wort. There is limited evidence that shows this may help mild to moderate depression. ? Black cohosh. There is evidence that this may help depression and mood swings.  Osteoporosis. Soy may help to decrease bone loss that is associated with menopause and may prevent osteoporosis. Limited evidence indicates that red clover may offer some bone loss protection as well. Other herbal products that are commonly used during menopause lack enough evidence to support their use as a replacement for conventional menopause therapies. These products include evening primrose, ginseng, and red clover. What are the cases when herbal products should not be used during menopause? Do not use herbal products during menopause without your health care provider's approval if:  You are taking medicine.  You have a preexisting liver condition.  Are there any risks in my taking herbal products during menopause? If you choose to use herbal products to help with symptoms of menopause, keep in mind that:  Different supplements have different and unmeasured   amounts of herbal ingredients.  Herbal products are not regulated the same way that medicines are.  Concentrations of herbs may vary depending on the way they are prepared. For example, the concentration may be different in a pill, tea, oil, and tincture.  Little is known about the risks of using herbal products, particularly the risks of long-term use.  Some herbal supplements can be harmful when combined with certain medicines.  Most commonly reported side effects of herbal products are mild. However, if used improperly, many herbal supplements can cause serious problems. Talk to your health care provider before starting any herbal product. If problems develop, stop taking the supplement and let your health care provider know. This  information is not intended to replace advice given to you by your health care provider. Make sure you discuss any questions you have with your health care provider. Document Released: 10/28/2007 Document Revised: 04/07/2016 Document Reviewed: 10/24/2013 Elsevier Interactive Patient Education  2017 [employer] Inc.  

## 2016-12-30 NOTE — Progress Notes (Signed)
56 y.o. G27P1001 Married Caucasian female here for annual exam.    On Effexor for menopausal symptoms and wants to stop. Having hot flashes at night.  Feels her heart is racing.  Feels hot and then cold.   Has significantly elevated cholesterol and triglycerides.  Has not seen her PCP in a long time.  She did not see her last year.   PCP:  Jeremy Johann, MD [facility]   No LMP recorded. Patient has had a hysterectomy.           Sexually active: Yes.    The current method of family planning is status post hysterectomy--ovaries remain.    Exercising: No.  The patient does not participate in regular exercise at present. Smoker:  no  Health Maintenance: Pap:  2005 normal History of abnormal Pap:  no MMG:  12/08/16 BIRADS 1negative/density c Colonoscopy:   07/2014 benign polyps with Dr. Alferd [facility]);next due 07/2024. BMD:   n/a  Result  n/a TDaP:  2010 HIV and Hep C: 12/12/15 Negative Screening Labs: discuss today -- patient is fasting   reports that she has never smoked. She has never used smokeless tobacco. She reports that she does not drink alcohol or use drugs.  Past Medical History:  Diagnosis Date  . Breast cyst october 2012   rigth breast  . Diarrhea   . Hyperlipidemia 2017  . IBS (irritable bowel syndrome)   . Rosacea   . Tendinitis    Rt arm 2011, Lt arm 01/2012  . Vitamin D deficiency 2016  . Weight loss, unintentional     Past Surgical History:  Procedure Laterality Date  . BREAST CYST ASPIRATION Left 2011   5cc  . BREAST EXCISIONAL BIOPSY Right   . BREAST SURGERY Right 03/09/11   exc of right breast duct system   . CHOLECYSTECTOMY  03/25/11   Laparoscopy  . LAPAROSCOPIC CHOLECYSTECTOMY  2002  . VAGINAL HYSTERECTOMY  1998   DUB    Current Outpatient Prescriptions  Medication Sig Dispense Refill  . Acetaminophen (TYLENOL PO) Take by mouth as needed.    . Calcium Carbonate-Vitamin D (CALCIUM 600+D PO) Take by mouth daily. Has 800mg   of vitamin D3    . Cholecalciferol (VITAMIN D3) 2000 units TABS Take 1 tablet by mouth daily.    Marland Kitchen Fexofenadine HCl (ALLEGRA PO) Take by mouth as needed.      . metroNIDAZOLE (METROGEL) 1 % gel Apply 1 application topically daily.    . Ranitidine HCl (RANITIDINE ACID REDUCER PO) Take 150 mg by mouth daily.     Marland Kitchen venlafaxine XR (EFFEXOR-XR) 75 MG 24 hr capsule Take 1 capsule (75 mg total) by mouth daily. 30 capsule 3   No current facility-administered medications for this visit.     Family History  Problem Relation Age of Onset  . Hypertension Mother   . COPD Mother   . Fibromyalgia Mother   . Hyperlipidemia Father   . Breast cancer Maternal Grandmother     ROS:  Pertinent items are noted in HPI.  Otherwise, a comprehensive ROS was negative.  Exam:   BP 128/76 (BP Location: Right Arm, Patient Position: Sitting, Cuff Size: Normal)   Pulse 88   Resp 16   Ht 5' 3.25" (1.607 m)   Wt 156 lb (70.8 kg)   BMI 27.42 kg/m     General appearance: alert, cooperative and appears stated age Head: Normocephalic, without obvious abnormality, atraumatic Neck: no adenopathy, supple, symmetrical, trachea midline  and thyroid normal to inspection and palpation Lungs: clear to auscultation bilaterally Breasts: normal appearance, no masses or tenderness, No nipple retraction or dimpling, No nipple discharge or bleeding, No axillary or supraclavicular adenopathy Heart: regular rate and rhythm Abdomen: soft, non-tender; no masses, no organomegaly Extremities: extremities normal, atraumatic, no cyanosis or edema Skin: Skin color, texture, turgor normal. No rashes or lesions Lymph nodes: Cervical, supraclavicular, and axillary nodes normal. No abnormal inguinal nodes palpated Neurologic: Grossly normal  Pelvic: External genitalia:  no lesions              Urethra:  normal appearing urethra with no masses, tenderness or lesions              Bartholins and Skenes: normal                 Vagina: normal  appearing vagina with normal color and discharge, no lesions              Cervix:  Absent.              Pap taken: No. Bimanual Exam:  Uterus:  Absent.              Adnexa: no mass, fullness, tenderness              Rectal exam: Yes.  .  Confirms.              Anus:  normal sphincter tone, no lesions  Chaperone was present for exam.  Assessment:   Well woman visit with normal exam. Status post total vaginal hysterectomy.  Menopausal symptoms.  Side effects from Effexor.  Hyperlipidemia.   Plan: Mammogram screening discussed. Recommended self breast awareness. Pap and HR HPV as above. Guidelines for Calcium, Vitamin D, regular exercise program including cardiovascular and weight bearing exercise. Will start weaning process from the Effexor.  New Rx for oral tabs 37.5 mg.  Take one daily for one month.  Then take 1/2 tab daily for one month, and then try to stop. She will follow up with PCP about her cholesterol.  She understands the importance. Labs with PCP. Follow up annually and prn.    After visit summary provided.

## 2017-01-06 ENCOUNTER — Telehealth: Payer: Self-pay | Admitting: Obstetrics and Gynecology

## 2017-01-06 MED ORDER — VENLAFAXINE HCL ER 37.5 MG PO CP24: 37.5000 mg | ORAL_CAPSULE | ORAL | 0 refills | Status: DC

## 2017-01-06 NOTE — Telephone Encounter (Signed)
I would have her return to the Effexor XR 37.5 mg and take every other day for  4 - 6 weeks and then stop.  I hope this works better for her!

## 2017-01-06 NOTE — Telephone Encounter (Signed)
Spoke with patient, advised as seen below per Dr. Quincy Simmonds. Rx for Effexor XR to verified pharmacy on file. Patient verbalizes understanding and is agreeable.   Patient is agreeable to disposition. Will close encounter.

## 2017-01-06 NOTE — Telephone Encounter (Signed)
Patient reports weaning off of Effexor, started 37.5 mg tab on 12/30/16. Takes in the morning with food, reports nausea with medication immediately with new dose. Patient states she did not experience nausea with 75 mg dose or 37.5 mg when initially starting medication. Patient states she is aware GI upset can be side effect, did not have before. Helps to take with food, resolves for about 30 min then returns, eating small meals throughout day. No changes in hot flashes.  Denies any other symptoms.   Patient asking if this is expected? Recommendations?  Advised patient would review with Dr. Quincy Simmonds and return call with recommendations, patient agreeable.  Dr. Quincy Simmonds, please advise?

## 2017-01-06 NOTE — Telephone Encounter (Signed)
Patient is asking to talk with a nurse about her medication.  °

## 2017-01-11 DIAGNOSIS — E7800 Pure hypercholesterolemia, unspecified: Secondary | ICD-10-CM | POA: Diagnosis not present

## 2017-03-05 DIAGNOSIS — L309 Dermatitis, unspecified: Secondary | ICD-10-CM | POA: Diagnosis not present

## 2017-03-15 DIAGNOSIS — L239 Allergic contact dermatitis, unspecified cause: Secondary | ICD-10-CM | POA: Diagnosis not present

## 2017-04-07 DIAGNOSIS — M722 Plantar fascial fibromatosis: Secondary | ICD-10-CM | POA: Diagnosis not present

## 2017-04-27 ENCOUNTER — Ambulatory Visit: Payer: BLUE CROSS/BLUE SHIELD | Attending: Orthopedic Surgery | Admitting: Physical Therapy

## 2017-04-27 ENCOUNTER — Encounter: Payer: Self-pay | Admitting: Physical Therapy

## 2017-04-27 DIAGNOSIS — R262 Difficulty in walking, not elsewhere classified: Secondary | ICD-10-CM | POA: Diagnosis not present

## 2017-04-27 DIAGNOSIS — R600 Localized edema: Secondary | ICD-10-CM | POA: Diagnosis not present

## 2017-04-27 DIAGNOSIS — M79671 Pain in right foot: Secondary | ICD-10-CM

## 2017-04-27 NOTE — Therapy (Signed)
[address] [place], [place], [address] Phone: [phone]   Fax:  [phone]  Physical Therapy Evaluation  Patient Details  Name: Denise Gibson MRN: [id number] Date of Birth: 1960-10-14 Referring Provider: Victorino December   Encounter Date: 04/27/2017  PT End of Session - 04/27/17 0823    Visit Number  1    Date for PT Re-Evaluation  06/28/17    PT Start Time  0750    PT Stop Time  0845    PT Time Calculation (min)  55 min    Activity Tolerance  Patient tolerated treatment well    Behavior During Therapy  [facility] for tasks assessed/performed       Past Medical History:  Diagnosis Date  . Breast cyst october 2012   rigth breast  . Diarrhea   . Hyperlipidemia 2017  . IBS (irritable bowel syndrome)   . Rosacea   . Tendinitis    Rt arm 2011, Lt arm 01/2012  . Vitamin D deficiency 2016  . Weight loss, unintentional     Past Surgical History:  Procedure Laterality Date  . BREAST CYST ASPIRATION Left 2011   5cc  . BREAST EXCISIONAL BIOPSY Right   . BREAST SURGERY Right 03/09/11   exc of right breast duct system   . CHOLECYSTECTOMY  03/25/11   Laparoscopy  . LAPAROSCOPIC CHOLECYSTECTOMY  2002  . VAGINAL HYSTERECTOMY  1998   DUB    There were no vitals filed for this visit.   Subjective Assessment - 04/27/17 0750    Subjective  Patient reports that she started having right foot pain over the past 4 months, she reports that she took a new job in mid October and had much worse pain.  No imaging has been done.    Limitations  Standing;Walking    Patient Stated Goals  have less pain    Currently in Pain?  Yes    Pain Score  4     Pain Location  Foot    Pain Orientation  Right    Pain Descriptors / Indicators  Aching;Sharp;Burning    Pain Type  Acute pain    Pain Onset  More than a month ago    Pain Frequency  Constant    Aggravating Factors   worse with standing, walking, some pain in th AM,  pain up to 10/10    Pain Relieving Factors  rest helps and pain can be 3-4/10    Effect of Pain on Daily Activities  just really hurts and limits walking and standing         [facility] PT Assessment - 04/27/17 0001      Assessment   Medical Diagnosis  right plantar fascitis    Referring Provider  Victorino December    Onset Date/Surgical Date  03/28/17    Prior Therapy  no      Precautions   Precautions  None      Balance Screen   Has the patient fallen in the past 6 months  No    Has the patient had a decrease in activity level because of a fear of falling?   No    Is the patient reluctant to leave their home because of a fear of falling?   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    Leisure  no exercise      Posture/Postural Control   Posture Comments  mild valgus at the knees, pretty significant valgus at the ankle, with arches that appear to collapse with weight bearing      ROM / Strength   AROM / PROM / Strength  AROM;PROM;Strength      AROM   Overall AROM Comments  AROM of the right ankle is 0 degrees DF , other motions WFL's      PROM   Overall PROM Comments  PROM of the right ankle DF is to 5 degrees but painful      Palpation   Palpation comment  she is very tight and tender in the right plantar fascia, the right heel and medial calcaneus      Ambulation/Gait   Gait Comments  no device, valgus at the ankle with over pronation, antalgic on the right, has small heel inserts , she goes down stairs one at a time             Objective measurements completed on examination: See above findings.      [facility] Adult PT Treatment/Exercise - 04/27/17 0001      Modalities   Modalities  Cryotherapy;Electrical Stimulation      Cryotherapy   Number Minutes  Cryotherapy  15 Minutes    Cryotherapy Location  Ankle    Type of Cryotherapy  Ice pack      Electrical Stimulation   Electrical Stimulation Location  right plantar foot    Electrical Stimulation Action  IFC    Electrical Stimulation Parameters  elevated    Electrical Stimulation Goals  Pain             PT Education - 04/27/17 [phone]    Education provided  Yes    Education Details  Plantar fascitis /calf stretches, education on orthotics, education on ice, massage, stretches    Person(s) Educated  Patient    Methods  Explanation;Demonstration;Tactile cues;Verbal cues    Comprehension  Returned demonstration;Verbalized understanding       PT Short Term Goals - 04/27/17 0827      PT SHORT TERM GOAL #1   Title  independent with initial HEP    Time  1    Period  Weeks    Status  New        PT Long Term Goals - 04/27/17 4010      PT LONG TERM GOAL #1   Title  decrease pain 50%    Time  8    Period  Weeks    Status  New      PT LONG TERM GOAL #2   Title  increase DF to 10 degrees actively    Time  8    Period  Weeks    Status  New      PT LONG TERM GOAL #3   Title  go down stairs step over step    Time  8    Period  Weeks    Status  New      PT LONG TERM GOAL #4   Title  walk without deviation    Time  8    Period  Weeks    Status  New             Plan - 04/27/17 2725  Clinical Impression Statement  Patient reports right foot pain starting in August when she was inactive, reports that she got a new job in October and really started having increased pain.  She is very tender along the right PF area and the heel.  She has valgus at the knees and ankles with not much of an arch.  She limps significantly, ROM was 0 degrees DF actively, has very tight calves    Clinical Presentation  Stable    Clinical Decision Making  Low    Rehab Potential  Good    PT Frequency  2x / week    PT Duration  8 weeks    PT Treatment/Interventions  ADLs/Self Care Home  Management;Cryotherapy;Electrical Stimulation;Iontophoresis 4mg /ml Dexamethasone;Ultrasound;Gait training;Balance training;Therapeutic exercise;Therapeutic activities;Functional mobility training;Stair training;Patient/family education;Manual techniques;Taping;Vasopneumatic Device    PT Next Visit Plan  assure stretches, could try STM and tape, or ionto    Consulted and Agree with Plan of Care  Patient       Patient will benefit from skilled therapeutic intervention in order to improve the following deficits and impairments:  Abnormal gait, Decreased range of motion, Difficulty walking, Increased fascial restricitons, Increased muscle spasms, Decreased activity tolerance, Pain, Impaired flexibility, Increased edema, Decreased mobility  Visit Diagnosis: Pain in right foot - Plan: PT plan of care cert/re-cert  Difficulty in walking, not elsewhere classified - Plan: PT plan of care cert/re-cert  Localized edema - Plan: PT plan of care cert/re-cert  G-Codes - 29/52/84 1324    Functional Assessment Tool Used (Outpatient Only)  foto 58% limitation    Functional Limitation  Mobility: Walking and moving around    Mobility: Walking and Moving Around Current Status (M0102)  At least 40 percent but less than 60 percent impaired, limited or restricted    Mobility: Walking and Moving Around Goal Status (V2536)  At least 20 percent but less than 40 percent impaired, limited or restricted        Problem List There are no active problems to display for this patient.   Sumner Boast., PT 04/27/2017, 8:39 AM  [place] [address] [place], [place], [address] Phone: [phone]   Fax:  [phone]  Name: Denise Gibson MRN: [id number] Date of Birth: 03-13-1961

## 2017-04-30 ENCOUNTER — Ambulatory Visit: Payer: BLUE CROSS/BLUE SHIELD | Admitting: Physical Therapy

## 2017-04-30 DIAGNOSIS — M79671 Pain in right foot: Secondary | ICD-10-CM | POA: Diagnosis not present

## 2017-04-30 DIAGNOSIS — R262 Difficulty in walking, not elsewhere classified: Secondary | ICD-10-CM

## 2017-04-30 DIAGNOSIS — R600 Localized edema: Secondary | ICD-10-CM | POA: Diagnosis not present

## 2017-04-30 NOTE — Therapy (Signed)
[place] [place], [place], [address] Phone: [phone]   Fax:  [phone]  Physical Therapy Treatment  Patient Details  Name: Denise Gibson MRN: [id number] Date of Birth: 1961-03-27 Referring Provider: Victorino December   Encounter Date: 04/30/2017  PT End of Session - 04/30/17 0827    Visit Number  2    Date for PT Re-Evaluation  06/28/17    PT Start Time  0800    PT Stop Time  0850    PT Time Calculation (min)  50 min       Past Medical History:  Diagnosis Date  . Breast cyst october 2012   rigth breast  . Diarrhea   . Hyperlipidemia 2017  . IBS (irritable bowel syndrome)   . Rosacea   . Tendinitis    Rt arm 2011, Lt arm 01/2012  . Vitamin D deficiency 2016  . Weight loss, unintentional     Past Surgical History:  Procedure Laterality Date  . BREAST CYST ASPIRATION Left 2011   5cc  . BREAST EXCISIONAL BIOPSY Right   . BREAST SURGERY Right 03/09/11   exc of right breast duct system   . CHOLECYSTECTOMY  03/25/11   Laparoscopy  . LAPAROSCOPIC CHOLECYSTECTOMY  2002  . VAGINAL HYSTERECTOMY  1998   DUB    There were no vitals filed for this visit.  Subjective Assessment - 04/30/17 0824    Subjective  doing ex, ice rolling and bought orthotic with arch support and heel cup    Currently in Pain?  Yes    Pain Score  4     Pain Location  Heel    Pain Orientation  Right                      [facility] Adult PT Treatment/Exercise - 04/30/17 0001      Modalities   Modalities  Cryotherapy;Electrical Stimulation;Ultrasound;Iontophoresis      Cryotherapy   Number Minutes Cryotherapy  15 Minutes    Cryotherapy Location  Ankle    Type of Cryotherapy  Ice pack      Electrical Stimulation   Electrical Stimulation Location  right plantar foot    Electrical Stimulation Action  IFC    Electrical Stimulation Parameters  elevated    Electrical Stimulation Goals  Pain;Edema      Ultrasound   Ultrasound Location  RT plantar fascia and heel    Ultrasound Parameters  1.2 w/cm2 100%      Iontophoresis   Type of Iontophoresis  Dexamethasone    Location  RT plantar towards heel    Dose  1.2 cc    Time  4 hour leave on patch      Manual Therapy   Manual Therapy  Soft tissue mobilization;Passive ROM    Manual therapy comments  very tender plantar fascia and incertion at heel               PT Short Term Goals - 04/27/17 0827      PT SHORT TERM GOAL #1   Title  independent with initial HEP    Time  1    Period  Weeks    Status  New        PT Long Term Goals - 04/27/17 2353      PT LONG TERM GOAL #1   Title  decrease pain 50%    Time  8    Period  Weeks    Status  New      PT LONG TERM GOAL #2   Title  increase DF to 10 degrees actively    Time  8    Period  Weeks    Status  New      PT LONG TERM GOAL #3   Title  go down stairs step over step    Time  8    Period  Weeks    Status  New      PT LONG TERM GOAL #4   Title  walk without deviation    Time  8    Period  Weeks    Status  New            Plan - 04/30/17 0827    Clinical Impression Statement  very tender RT plantar facsia and heel incertion, deep STW to tolerance and stretches for DF and toe ext. modalities to calm down inflammation    PT Treatment/Interventions  ADLs/Self Care Home Management;Cryotherapy;Electrical Stimulation;Iontophoresis 4mg /ml Dexamethasone;Ultrasound;Gait training;Balance training;Therapeutic exercise;Therapeutic activities;Functional mobility training;Stair training;Patient/family education;Manual techniques;Taping;Vasopneumatic Device    PT Next Visit Plan  assess and progress, may add taping       Patient will benefit from skilled therapeutic intervention in order to improve the following deficits and impairments:  Abnormal gait, Decreased range of motion, Difficulty walking, Increased fascial restricitons, Increased muscle spasms, Decreased  activity tolerance, Pain, Impaired flexibility, Increased edema, Decreased mobility  Visit Diagnosis: Pain in right foot  Difficulty in walking, not elsewhere classified  Localized edema     Problem List There are no active problems to display for this patient.   PAYSEUR,ANGIE PTA 04/30/2017, 8:29 AM  [place] [address] [place], [place], [address] Phone: [phone]   Fax:  [phone]  Name: Denise Gibson MRN: [id number] Date of Birth: May 05, 1961

## 2017-05-04 ENCOUNTER — Ambulatory Visit: Payer: BLUE CROSS/BLUE SHIELD | Admitting: Physical Therapy

## 2017-05-06 ENCOUNTER — Encounter: Payer: Self-pay | Admitting: Physical Therapy

## 2017-05-06 ENCOUNTER — Ambulatory Visit: Payer: BLUE CROSS/BLUE SHIELD | Admitting: Physical Therapy

## 2017-05-06 DIAGNOSIS — M79671 Pain in right foot: Secondary | ICD-10-CM | POA: Diagnosis not present

## 2017-05-06 DIAGNOSIS — R262 Difficulty in walking, not elsewhere classified: Secondary | ICD-10-CM | POA: Diagnosis not present

## 2017-05-06 DIAGNOSIS — R600 Localized edema: Secondary | ICD-10-CM

## 2017-05-06 NOTE — Therapy (Signed)
[place] [address] [place], [place], [address] Phone: [phone]   Fax:  [phone]  Physical Therapy Treatment  Patient Details  Name: Denise Gibson MRN: [id number] Date of Birth: 04/21/1961 Referring Provider: Victorino December   Encounter Date: 05/06/2017  PT End of Session - 05/06/17 0839    Visit Number  3    Date for PT Re-Evaluation  06/28/17    PT Start Time  0800    PT Stop Time  0855    PT Time Calculation (min)  55 min    Activity Tolerance  Patient tolerated treatment well    Behavior During Therapy  [facility] for tasks assessed/performed       Past Medical History:  Diagnosis Date  . Breast cyst october 2012   rigth breast  . Diarrhea   . Hyperlipidemia 2017  . IBS (irritable bowel syndrome)   . Rosacea   . Tendinitis    Rt arm 2011, Lt arm 01/2012  . Vitamin D deficiency 2016  . Weight loss, unintentional     Past Surgical History:  Procedure Laterality Date  . BREAST CYST ASPIRATION Left 2011   5cc  . BREAST EXCISIONAL BIOPSY Right   . BREAST SURGERY Right 03/09/11   exc of right breast duct system   . CHOLECYSTECTOMY  03/25/11   Laparoscopy  . LAPAROSCOPIC CHOLECYSTECTOMY  2002  . VAGINAL HYSTERECTOMY  1998   DUB    There were no vitals filed for this visit.  Subjective Assessment - 05/06/17 0801    Subjective  Pt reports that things are going fine    Currently in Pain?  Yes    Pain Score  4     Pain Location  Heel    Pain Orientation  Right                      [facility] Adult PT Treatment/Exercise - 05/06/17 0001      Exercises   Exercises  Ankle      Modalities   Modalities  Cryotherapy;Electrical Stimulation;Ultrasound;Iontophoresis      Cryotherapy   Number Minutes Cryotherapy  15 Minutes    Cryotherapy Location  Ankle    Type of Cryotherapy  Ice pack      Electrical Stimulation   Electrical Stimulation Location  right plantar foot    Electrical  Stimulation Action  IFC    Electrical Stimulation Parameters  elevated    Electrical Stimulation Goals  Pain;Edema      Ultrasound   Ultrasound Location  PT plantat fascia    Ultrasound Parameters  1.1 MHz 1.0w/cm2      Iontophoresis   Type of Iontophoresis  Dexamethasone    Location  RT plantar towards heel    Dose  1.2 cc    Time  4 hour leave on patch      Manual Therapy   Manual Therapy  Soft tissue mobilization;Passive ROM    Manual therapy comments  very tender plantar fascia and incertion at heel      Ankle Exercises: Stretches   Gastroc Stretch  4 reps;10 seconds               PT Short Term Goals - 05/06/17 0844      PT SHORT TERM GOAL #1   Title  independent with initial HEP    Status  Achieved        PT Long Term Goals - 04/27/17  0834      PT LONG TERM GOAL #1   Title  decrease pain 50%    Time  8    Period  Weeks    Status  New      PT LONG TERM GOAL #2   Title  increase DF to 10 degrees actively    Time  8    Period  Weeks    Status  New      PT LONG TERM GOAL #3   Title  go down stairs step over step    Time  8    Period  Weeks    Status  New      PT LONG TERM GOAL #4   Title  walk without deviation    Time  8    Period  Weeks    Status  New            Plan - 05/06/17 2330    Clinical Impression Statement  Pt with some tenderness in the arch of her R foot and plantar fascia heel insertion during MT. Pt tolerated aerobic warm up and gastroc stretching well.     Rehab Potential  Good    PT Frequency  2x / week    PT Duration  8 weeks    PT Treatment/Interventions  ADLs/Self Care Home Management;Cryotherapy;Electrical Stimulation;Iontophoresis 4mg /ml Dexamethasone;Ultrasound;Gait training;Balance training;Therapeutic exercise;Therapeutic activities;Functional mobility training;Stair training;Patient/family education;Manual techniques;Taping;Vasopneumatic Device    PT Next Visit Plan  assess and progress, may add taping        Patient will benefit from skilled therapeutic intervention in order to improve the following deficits and impairments:  Abnormal gait, Decreased range of motion, Difficulty walking, Increased fascial restricitons, Increased muscle spasms, Decreased activity tolerance, Pain, Impaired flexibility, Increased edema, Decreased mobility  Visit Diagnosis: Difficulty in walking, not elsewhere classified  Pain in right foot  Localized edema     Problem List There are no active problems to display for this patient.   Willey Blade 05/06/2017, 8:45 AM  [place] [address] [place], [place], [address] Phone: [phone]   Fax:  [phone]  Name: Denise Gibson MRN: [id number] Date of Birth: 28-Sep-1960

## 2017-05-11 ENCOUNTER — Ambulatory Visit: Payer: BLUE CROSS/BLUE SHIELD | Admitting: Physical Therapy

## 2017-05-11 ENCOUNTER — Encounter: Payer: Self-pay | Admitting: Physical Therapy

## 2017-05-11 DIAGNOSIS — R262 Difficulty in walking, not elsewhere classified: Secondary | ICD-10-CM | POA: Diagnosis not present

## 2017-05-11 DIAGNOSIS — R600 Localized edema: Secondary | ICD-10-CM | POA: Diagnosis not present

## 2017-05-11 DIAGNOSIS — M79671 Pain in right foot: Secondary | ICD-10-CM

## 2017-05-11 NOTE — Therapy (Signed)
[place] [address] [place], [place], [address] Phone: [phone]   Fax:  [phone]  Physical Therapy Treatment  Patient Details  Name: Denise Gibson MRN: [id number] Date of Birth: 1961-03-17 Referring Provider: Victorino December   Encounter Date: 05/11/2017  PT End of Session - 05/11/17 0841    Visit Number  4    Date for PT Re-Evaluation  06/28/17    PT Start Time  0755    PT Stop Time  0856    PT Time Calculation (min)  61 min    Activity Tolerance  Patient tolerated treatment well    Behavior During Therapy  [facility] for tasks assessed/performed       Past Medical History:  Diagnosis Date  . Breast cyst october 2012   rigth breast  . Diarrhea   . Hyperlipidemia 2017  . IBS (irritable bowel syndrome)   . Rosacea   . Tendinitis    Rt arm 2011, Lt arm 01/2012  . Vitamin D deficiency 2016  . Weight loss, unintentional     Past Surgical History:  Procedure Laterality Date  . BREAST CYST ASPIRATION Left 2011   5cc  . BREAST EXCISIONAL BIOPSY Right   . BREAST SURGERY Right 03/09/11   exc of right breast duct system   . CHOLECYSTECTOMY  03/25/11   Laparoscopy  . LAPAROSCOPIC CHOLECYSTECTOMY  2002  . VAGINAL HYSTERECTOMY  1998   DUB    There were no vitals filed for this visit.  Subjective Assessment - 05/11/17 0807    Subjective  Reports maybe a little better, still hurting, worse in the AM, and worse with any walking, reports that the ice the stim and the rolling helps    Currently in Pain?  Yes    Pain Score  4     Pain Location  Heel    Pain Orientation  Right    Aggravating Factors   standing, walking, morning                      [facility] Adult PT Treatment/Exercise - 05/11/17 0001      Modalities   Modalities  Vasopneumatic      Electrical Stimulation   Electrical Stimulation Location  right plantar foot    Electrical Stimulation Action  IFC    Electrical Stimulation  Parameters  elevated    Electrical Stimulation Goals  Edema;Pain      Vasopneumatic   Number Minutes Vasopneumatic   15 minutes    Vasopnuematic Location   Ankle    Vasopneumatic Pressure  Medium    Vasopneumatic Temperature   33      Manual Therapy   Manual Therapy  Soft tissue mobilization;Passive ROM;Taping    Manual therapy comments  very tender plantar fascia and incertion at heel    McConnell  Support of arch and navicular, 3 pieces      Ankle Exercises: Aerobic   Stationary Bike  x 5 minutes      Ankle Exercises: Stretches   Soleus Stretch  4 reps;20 seconds    Gastroc Stretch  4 reps;20 seconds               PT Short Term Goals - 05/06/17 0844      PT SHORT TERM GOAL #1   Title  independent with initial HEP    Status  Achieved        PT Long Term Goals - 04/27/17  0834      PT LONG TERM GOAL #1   Title  decrease pain 50%    Time  8    Period  Weeks    Status  New      PT LONG TERM GOAL #2   Title  increase DF to 10 degrees actively    Time  8    Period  Weeks    Status  New      PT LONG TERM GOAL #3   Title  go down stairs step over step    Time  8    Period  Weeks    Status  New      PT LONG TERM GOAL #4   Title  walk without deviation    Time  8    Period  Weeks    Status  New            Plan - 05/11/17 0841    Clinical Impression Statement  Remians very tender and sore, tried to change it up today and added vaso and tape    PT Next Visit Plan  see if the tape helped    Consulted and Agree with Plan of Care  Patient       Patient will benefit from skilled therapeutic intervention in order to improve the following deficits and impairments:  Abnormal gait, Decreased range of motion, Difficulty walking, Increased fascial restricitons, Increased muscle spasms, Decreased activity tolerance, Pain, Impaired flexibility, Increased edema, Decreased mobility  Visit Diagnosis: Difficulty in walking, not elsewhere classified  Pain in  right foot  Localized edema     Problem List There are no active problems to display for this patient.   Sumner Boast., PT 05/11/2017, 8:43 AM  [address] [address] [place], [place], [address] Phone: [phone]   Fax:  [phone]  Name: Denise Gibson MRN: [id number] Date of Birth: 02/03/1961

## 2017-05-14 ENCOUNTER — Ambulatory Visit: Payer: BLUE CROSS/BLUE SHIELD | Admitting: Physical Therapy

## 2017-05-14 DIAGNOSIS — R262 Difficulty in walking, not elsewhere classified: Secondary | ICD-10-CM

## 2017-05-14 DIAGNOSIS — M79671 Pain in right foot: Secondary | ICD-10-CM | POA: Diagnosis not present

## 2017-05-14 DIAGNOSIS — R600 Localized edema: Secondary | ICD-10-CM

## 2017-05-14 NOTE — Therapy (Signed)
[address] [place], [place], [address] Phone: [phone]   Fax:  [phone]  Physical Therapy Treatment  Patient Details  Name: Denise Gibson MRN: [id number] Date of Birth: Jan 03, 1961 Referring Provider: Victorino December   Encounter Date: 05/14/2017  PT End of Session - 05/14/17 0824    Visit Number  5    Date for PT Re-Evaluation  06/28/17    PT Start Time  0800    PT Stop Time  0850    PT Time Calculation (min)  50 min       Past Medical History:  Diagnosis Date  . Breast cyst october 2012   rigth breast  . Diarrhea   . Hyperlipidemia 2017  . IBS (irritable bowel syndrome)   . Rosacea   . Tendinitis    Rt arm 2011, Lt arm 01/2012  . Vitamin D deficiency 2016  . Weight loss, unintentional     Past Surgical History:  Procedure Laterality Date  . BREAST CYST ASPIRATION Left 2011   5cc  . BREAST EXCISIONAL BIOPSY Right   . BREAST SURGERY Right 03/09/11   exc of right breast duct system   . CHOLECYSTECTOMY  03/25/11   Laparoscopy  . LAPAROSCOPIC CHOLECYSTECTOMY  2002  . VAGINAL HYSTERECTOMY  1998   DUB    There were no vitals filed for this visit.  Subjective Assessment - 05/14/17 0820    Subjective  50% better with tape, occassional shooting pain but constant pain better    Currently in Pain?  Yes    Pain Score  4     Pain Location  Foot         [facility] PT Assessment - 05/14/17 0001      AROM   Overall AROM Comments  DF 15                  [facility] Adult PT Treatment/Exercise - 05/14/17 0001      Exercises   Exercises  Ankle      Electrical Stimulation   Electrical Stimulation Location  right plantar foot    Electrical Stimulation Action  IFC    Electrical Stimulation Parameters  elevated    Electrical Stimulation Goals  Edema;Pain      Vasopneumatic   Number Minutes Vasopneumatic   15 minutes    Vasopnuematic Location   Ankle    Vasopneumatic Pressure   Medium    Vasopneumatic Temperature   33      Manual Therapy   Manual Therapy  Soft tissue mobilization;Passive ROM;Taping    Manual therapy comments  very tender plantar fascia and incertion at heel less ropey and nodules noted    McConnell  Support of arch and navicular, 3 pieces      Ankle Exercises: Stretches   Soleus Stretch  4 reps;20 seconds    Gastroc Stretch  4 reps;20 seconds      Ankle Exercises: Seated   Other Seated Ankle Exercises  red tband 4 way 15 times each               PT Short Term Goals - 05/06/17 0844      PT SHORT TERM GOAL #1   Title  independent with initial HEP    Status  Achieved        PT Long Term Goals - 05/14/17 0825      PT LONG TERM GOAL #1   Title  decrease pain 50%  Status  Achieved      PT LONG TERM GOAL #2   Title  increase DF to 10 degrees actively    Baseline  15    Status  Achieved      PT LONG TERM GOAL #3   Title  go down stairs step over step    Status  Partially Met      PT LONG TERM GOAL #4   Title  walk without deviation    Status  Partially Met            Plan - 05/14/17 0836    Clinical Impression Statement  still very tender but less nudlues and ropiness feeling with STW. AROM WNLs .progressing with goals    PT Treatment/Interventions  ADLs/Self Care Home Management;Cryotherapy;Electrical Stimulation;Iontophoresis 33m/ml Dexamethasone;Ultrasound;Gait training;Balance training;Therapeutic exercise;Therapeutic activities;Functional mobility training;Stair training;Patient/family education;Manual techniques;Taping;Vasopneumatic Device    PT Next Visit Plan  assess from MD appt       Patient will benefit from skilled therapeutic intervention in order to improve the following deficits and impairments:  Abnormal gait, Decreased range of motion, Difficulty walking, Increased fascial restricitons, Increased muscle spasms, Decreased activity tolerance, Pain, Impaired flexibility, Increased edema, Decreased  mobility  Visit Diagnosis: Difficulty in walking, not elsewhere classified  Pain in right foot  Localized edema     Problem List There are no active problems to display for this patient.   PAYSEUR,ANGIE PTA 05/14/2017, 8:37 AM  [place] [address]Phone: [phone]  Fax:  [phone] Name: Denise BRESHEARSMRN: [id number] of Birth: [birth year]

## 2017-05-19 DIAGNOSIS — M722 Plantar fascial fibromatosis: Secondary | ICD-10-CM | POA: Diagnosis not present

## 2017-05-20 ENCOUNTER — Ambulatory Visit: Payer: BLUE CROSS/BLUE SHIELD | Admitting: Physical Therapy

## 2017-05-20 DIAGNOSIS — R600 Localized edema: Secondary | ICD-10-CM

## 2017-05-20 DIAGNOSIS — R262 Difficulty in walking, not elsewhere classified: Secondary | ICD-10-CM | POA: Diagnosis not present

## 2017-05-20 DIAGNOSIS — M79671 Pain in right foot: Secondary | ICD-10-CM

## 2017-05-20 NOTE — Therapy (Signed)
[address] [place], [place], [address] Phone: [phone]   Fax:  [phone]  Physical Therapy Treatment  Patient Details  Name: Denise Gibson MRN: [id number] Date of Birth: 07-19-1960 Referring Provider: Victorino December   Encounter Date: 05/20/2017  PT End of Session - 05/20/17 1210    Visit Number  6    Date for PT Re-Evaluation  06/28/17    PT Start Time  8208    PT Stop Time  1230    PT Time Calculation (min)  45 min       Past Medical History:  Diagnosis Date  . Breast cyst october 2012   rigth breast  . Diarrhea   . Hyperlipidemia 2017  . IBS (irritable bowel syndrome)   . Rosacea   . Tendinitis    Rt arm 2011, Lt arm 01/2012  . Vitamin D deficiency 2016  . Weight loss, unintentional     Past Surgical History:  Procedure Laterality Date  . BREAST CYST ASPIRATION Left 2011   5cc  . BREAST EXCISIONAL BIOPSY Right   . BREAST SURGERY Right 03/09/11   exc of right breast duct system   . CHOLECYSTECTOMY  03/25/11   Laparoscopy  . LAPAROSCOPIC CHOLECYSTECTOMY  2002  . VAGINAL HYSTERECTOMY  1998   DUB    There were no vitals filed for this visit.  Subjective Assessment - 05/20/17 1207    Subjective  saw MD and he was pleased- wanted to do an injection but I did not want    Currently in Pain?  Yes    Pain Score  3                       [facility] Adult PT Treatment/Exercise - 05/20/17 0001      Electrical Stimulation   Electrical Stimulation Location  right plantar foot    Electrical Stimulation Action  IFC    Electrical Stimulation Goals  Edema;Pain      Iontophoresis   Type of Iontophoresis  Dexamethasone    Location  RT plantar- cuniform    Dose  1.2 cc    Time  4 hour leave on patch      Vasopneumatic   Number Minutes Vasopneumatic   15 minutes    Vasopnuematic Location   Ankle    Vasopneumatic Pressure  Medium    Vasopneumatic Temperature   33      Manual Therapy   Manual Therapy  Soft tissue mobilization;Passive ROM;Taping;Joint mobilization    Manual therapy comments  hyper mobility at [employer] of arch and navicular, 3 pieces               PT Short Term Goals - 05/06/17 0844      PT SHORT TERM GOAL #1   Title  independent with initial HEP    Status  Achieved        PT Long Term Goals - 05/14/17 0825      PT LONG TERM GOAL #1   Title  decrease pain 50%    Status  Achieved      PT LONG TERM GOAL #2   Title  increase DF to 10 degrees actively    Baseline  15    Status  Achieved      PT LONG TERM GOAL #3   Title  go down stairs step over step    Status  Partially Met      PT LONG TERM GOAL #4   Title  walk without deviation    Status  Partially Met            Plan - 05/20/17 1210    Clinical Impression Statement  decreased tightness with increased mobility but hyper mobile adn pain at cuniform noted. pt continues to get relief from tape    PT Treatment/Interventions  ADLs/Self Care Home Management;Cryotherapy;Electrical Stimulation;Iontophoresis 71m/ml Dexamethasone;Ultrasound;Gait training;Balance training;Therapeutic exercise;Therapeutic activities;Functional mobility training;Stair training;Patient/family education;Manual techniques;Taping;Vasopneumatic Device    PT Next Visit Plan  assess ionto at cuniform       Patient will benefit from skilled therapeutic intervention in order to improve the following deficits and impairments:  Abnormal gait, Decreased range of motion, Difficulty walking, Increased fascial restricitons, Increased muscle spasms, Decreased activity tolerance, Pain, Impaired flexibility, Increased edema, Decreased mobility  Visit Diagnosis: Difficulty in walking, not elsewhere classified  Pain in right foot  Localized edema     Problem List There are no active problems to display for this patient.   PAYSEUR,ANGIE PTA 05/20/2017, 12:11 PM  [place] [address]Phone: [phone]  Fax:  [phone] Name: Denise BUESCHERMRN: [id number] of Birth: [birth year]

## 2017-05-31 ENCOUNTER — Ambulatory Visit: Payer: BLUE CROSS/BLUE SHIELD | Attending: Orthopedic Surgery | Admitting: Physical Therapy

## 2017-05-31 ENCOUNTER — Encounter: Payer: Self-pay | Admitting: Physical Therapy

## 2017-05-31 DIAGNOSIS — R600 Localized edema: Secondary | ICD-10-CM | POA: Diagnosis not present

## 2017-05-31 DIAGNOSIS — M79671 Pain in right foot: Secondary | ICD-10-CM | POA: Diagnosis not present

## 2017-05-31 DIAGNOSIS — R262 Difficulty in walking, not elsewhere classified: Secondary | ICD-10-CM | POA: Diagnosis not present

## 2017-05-31 NOTE — Therapy (Signed)
[address] [place], [place], [address] Phone: [phone]   Fax:  [phone]  Physical Therapy Treatment  Patient Details  Name: Denise Gibson MRN: [id number] Date of Birth: January 25, 1961 Referring Provider: Victorino December   Encounter Date: 05/31/2017  PT End of Session - 05/31/17 0839    Visit Number  7    Date for PT Re-Evaluation  06/28/17    PT Start Time  0757    PT Stop Time  0846    PT Time Calculation (min)  49 min    Activity Tolerance  Patient tolerated treatment well    Behavior During Therapy  [facility] for tasks assessed/performed       Past Medical History:  Diagnosis Date  . Breast cyst october 2012   rigth breast  . Diarrhea   . Hyperlipidemia 2017  . IBS (irritable bowel syndrome)   . Rosacea   . Tendinitis    Rt arm 2011, Lt arm 01/2012  . Vitamin D deficiency 2016  . Weight loss, unintentional     Past Surgical History:  Procedure Laterality Date  . BREAST CYST ASPIRATION Left 2011   5cc  . BREAST EXCISIONAL BIOPSY Right   . BREAST SURGERY Right 03/09/11   exc of right breast duct system   . CHOLECYSTECTOMY  03/25/11   Laparoscopy  . LAPAROSCOPIC CHOLECYSTECTOMY  2002  . VAGINAL HYSTERECTOMY  1998   DUB    There were no vitals filed for this visit.  Subjective Assessment - 05/31/17 0803    Subjective  Reports that she was doing really well, but due to schedules could not get an appointment to come in, she reports that by Wednesday she was really hurting again, she reports that she bought some athletic tape and tried to tape herself, with some good from this    Currently in Pain?  Yes    Pain Score  3     Pain Location  Foot    Pain Orientation  Right    Aggravating Factors   standing    Pain Relieving Factors  the tape really helps                      [facility] Adult PT Treatment/Exercise - 05/31/17 0001      Electrical Stimulation   Electrical  Stimulation Location  right plantar foot    Electrical Stimulation Action  IFC    Electrical Stimulation Parameters  elevated    Electrical Stimulation Goals  Edema;Pain      Vasopneumatic   Number Minutes Vasopneumatic   15 minutes    Vasopnuematic Location   Ankle    Vasopneumatic Pressure  Medium    Vasopneumatic Temperature   33      Manual Therapy   Manual Therapy  Soft tissue mobilization;Passive ROM;Taping;Joint mobilization    Manual therapy comments  hyper mobility at [employer] of arch and navicular, 3 pieces      Ankle Exercises: Aerobic   Stationary Bike  x 5 minutes      Ankle Exercises: Stretches   Soleus Stretch  4 reps;20 seconds    Gastroc Stretch  4 reps;20 seconds      Ankle Exercises: Seated   Towel Crunch  5 reps               PT Short Term Goals - 05/06/17 8242      PT  SHORT TERM GOAL #1   Title  independent with initial HEP    Status  Achieved        PT Long Term Goals - 05/14/17 0825      PT LONG TERM GOAL #1   Title  decrease pain 50%    Status  Achieved      PT LONG TERM GOAL #2   Title  increase DF to 10 degrees actively    Baseline  15    Status  Achieved      PT LONG TERM GOAL #3   Title  go down stairs step over step    Status  Partially Met      PT LONG TERM GOAL #4   Title  walk without deviation    Status  Partially Met            Plan - 05/31/17 0840    Clinical Impression Statement  Patient really feels the tape helps the most, added towel crunches today, she missed a week of PT due to schedules    PT Next Visit Plan  want to teach self taping    Consulted and Agree with Plan of Care  Patient       Patient will benefit from skilled therapeutic intervention in order to improve the following deficits and impairments:  Abnormal gait, Decreased range of motion, Difficulty walking, Increased fascial restricitons, Increased muscle spasms, Decreased activity tolerance, Pain, Impaired  flexibility, Increased edema, Decreased mobility  Visit Diagnosis: Difficulty in walking, not elsewhere classified  Pain in right foot  Localized edema     Problem List There are no active problems to display for this patient.   Sumner Boast., PT 05/31/2017, 8:45 AM  [place] [address] [facility] [place], [place], [address] Phone: [phone]   Fax:  [phone]  Name: Denise Gibson MRN: [id number] Date of Birth: March 14, 1961

## 2017-06-03 ENCOUNTER — Ambulatory Visit: Payer: BLUE CROSS/BLUE SHIELD | Admitting: Physical Therapy

## 2017-06-03 ENCOUNTER — Encounter: Payer: Self-pay | Admitting: Physical Therapy

## 2017-06-03 DIAGNOSIS — R600 Localized edema: Secondary | ICD-10-CM | POA: Diagnosis not present

## 2017-06-03 DIAGNOSIS — R262 Difficulty in walking, not elsewhere classified: Secondary | ICD-10-CM | POA: Diagnosis not present

## 2017-06-03 DIAGNOSIS — M79671 Pain in right foot: Secondary | ICD-10-CM

## 2017-06-03 NOTE — Therapy (Signed)
[place] [address] [place], [place], [address] Phone: [phone]   Fax:  [phone]  Physical Therapy Treatment  Patient Details  Name: Denise Gibson MRN: [id number] Date of Birth: 07/24/1960 Referring Provider: Victorino December   Encounter Date: 06/03/2017  PT End of Session - 06/03/17 0809    Visit Number  8    Date for PT Re-Evaluation  06/28/17    PT Start Time  0800    PT Stop Time  2094    PT Time Calculation (min)  55 min       Past Medical History:  Diagnosis Date  . Breast cyst october 2012   rigth breast  . Diarrhea   . Hyperlipidemia 2017  . IBS (irritable bowel syndrome)   . Rosacea   . Tendinitis    Rt arm 2011, Lt arm 01/2012  . Vitamin D deficiency 2016  . Weight loss, unintentional     Past Surgical History:  Procedure Laterality Date  . BREAST CYST ASPIRATION Left 2011   5cc  . BREAST EXCISIONAL BIOPSY Right   . BREAST SURGERY Right 03/09/11   exc of right breast duct system   . CHOLECYSTECTOMY  03/25/11   Laparoscopy  . LAPAROSCOPIC CHOLECYSTECTOMY  2002  . VAGINAL HYSTERECTOMY  1998   DUB    There were no vitals filed for this visit.  Subjective Assessment - 06/03/17 0804    Subjective  tape helps the most    Currently in Pain?  Yes    Pain Score  3     Pain Location  Foot    Pain Orientation  Right                      [facility] Adult PT Treatment/Exercise - 06/03/17 0001      Electrical Stimulation   Electrical Stimulation Location  right plantar foot    Electrical Stimulation Action  IFC    Electrical Stimulation Parameters  elevated    Electrical Stimulation Goals  Edema;Pain      Vasopneumatic   Number Minutes Vasopneumatic   15 minutes    Vasopnuematic Location   Ankle    Vasopneumatic Pressure  Medium    Vasopneumatic Temperature   33      Manual Therapy   Manual Therapy  Soft tissue mobilization;Passive ROM;Taping;Joint mobilization    Manual therapy comments  hyper mobility at [employer] of arch and navicular, 3 pieces      Ankle Exercises: Aerobic   Stationary Bike  Nustep L 5 5 min LE only      Ankle Exercises: Stretches   Soleus Stretch  4 reps;20 seconds    Gastroc Stretch  4 reps;20 seconds      Ankle Exercises: Standing   Other Standing Ankle Exercises  heel raises/toe raises black bar    Other Standing Ankle Exercises  calf raises leg press 30# 2 set s15               PT Short Term Goals - 05/06/17 0844      PT SHORT TERM GOAL #1   Title  independent with initial HEP    Status  Achieved        PT Long Term Goals - 06/03/17 0809      PT LONG TERM GOAL #1   Title  decrease pain 50%    Status  Achieved  PT LONG TERM GOAL #2   Title  increase DF to 10 degrees actively    Status  Achieved      PT LONG TERM GOAL #3   Title  go down stairs step over step    Status  Partially Met      PT LONG TERM GOAL #4   Title  walk without deviation    Status  Partially Met            Plan - 06/03/17 0810    Clinical Impression Statement  information given to pt re: checking into orthotics to support navicular and cuniform since tape gives best relief. progressing with goals - gait is still an issue esp. as day progresses and pain increases    PT Treatment/Interventions  ADLs/Self Care Home Management;Cryotherapy;Electrical Stimulation;Iontophoresis 14m/ml Dexamethasone;Ultrasound;Gait training;Balance training;Therapeutic exercise;Therapeutic activities;Functional mobility training;Stair training;Patient/family education;Manual techniques;Taping;Vasopneumatic Device    PT Next Visit Plan  want to teach self taping       Patient will benefit from skilled therapeutic intervention in order to improve the following deficits and impairments:  Abnormal gait, Decreased range of motion, Difficulty walking, Increased fascial restricitons, Increased muscle spasms, Decreased  activity tolerance, Pain, Impaired flexibility, Increased edema, Decreased mobility  Visit Diagnosis: Difficulty in walking, not elsewhere classified  Pain in right foot  Localized edema     Problem List There are no active problems to display for this patient.   PAYSEUR,ANGIE  PTA 06/03/2017, 8:12 AM   [place] [address]Phone: [phone]  Fax:  [phone] Name: Denise TISONMRN: [id number] of Birth: [birth year]

## 2017-06-08 ENCOUNTER — Ambulatory Visit: Payer: BLUE CROSS/BLUE SHIELD | Admitting: Physical Therapy

## 2017-06-08 DIAGNOSIS — R262 Difficulty in walking, not elsewhere classified: Secondary | ICD-10-CM

## 2017-06-08 DIAGNOSIS — R600 Localized edema: Secondary | ICD-10-CM

## 2017-06-08 DIAGNOSIS — M79671 Pain in right foot: Secondary | ICD-10-CM

## 2017-06-08 NOTE — Therapy (Signed)
[place], [place], [address] Phone: [phone]   Fax:  [phone]  Physical Therapy Treatment  Patient Details  Name: Denise Gibson MRN: [id number] Date of Birth: 08-19-1960 Referring Provider: Victorino December   Encounter Date: 06/08/2017  PT End of Session - 06/08/17 0811    Visit Number  9    Date for PT Re-Evaluation  06/28/17    PT Start Time  0800    PT Stop Time  0855    PT Time Calculation (min)  55 min       Past Medical History:  Diagnosis Date  . Breast cyst october 2012   rigth breast  . Diarrhea   . Hyperlipidemia 2017  . IBS (irritable bowel syndrome)   . Rosacea   . Tendinitis    Rt arm 2011, Lt arm 01/2012  . Vitamin D deficiency 2016  . Weight loss, unintentional     Past Surgical History:  Procedure Laterality Date  . BREAST CYST ASPIRATION Left 2011   5cc  . BREAST EXCISIONAL BIOPSY Right   . BREAST SURGERY Right 03/09/11   exc of right breast duct system   . CHOLECYSTECTOMY  03/25/11   Laparoscopy  . LAPAROSCOPIC CHOLECYSTECTOMY  2002  . VAGINAL HYSTERECTOMY  1998   DUB    There were no vitals filed for this visit.  Subjective Assessment - 06/08/17 0803    Subjective  tape still helps , have not looked for orthotics d/t getting sick after last session    Currently in Pain?  Yes    Pain Score  2     Pain Location  Foot    Pain Orientation  Right                      [facility] Adult PT Treatment/Exercise - 06/08/17 0001      Electrical Stimulation   Electrical Stimulation Location  right plantar foot    Electrical Stimulation Action  IFC    Electrical Stimulation Parameters  elevated    Electrical Stimulation Goals  Edema;Pain      Vasopneumatic   Number Minutes Vasopneumatic   15 minutes    Vasopnuematic Location   Ankle    Vasopneumatic Pressure  Medium    Vasopneumatic Temperature   33      Manual Therapy   Manual Therapy   Soft tissue mobilization;Passive ROM;Taping;Joint mobilization    [employer] of arch and navicular, 3 pieces      Ankle Exercises: Aerobic   Stationary Bike  Nustep L 5 5 min LE only      Ankle Exercises: Stretches   Soleus Stretch  4 reps;20 seconds    Gastroc Stretch  4 reps;20 seconds      Ankle Exercises: Standing   Other Standing Ankle Exercises  heel raises/toe raises black bar    Other Standing Ankle Exercises  calf raises leg press 30# 2 set s15             PT Education - 06/08/17 0810    Education provided  Yes    Education Details  info given to purchase cover roll and leukotape    Person(s) Educated  Patient    Methods  Handout       PT Short Term Goals - 05/06/17 0844      PT SHORT TERM GOAL #1   Title  independent with initial HEP  Status  Achieved        PT Long Term Goals - 06/03/17 0809      PT LONG TERM GOAL #1   Title  decrease pain 50%    Status  Achieved      PT LONG TERM GOAL #2   Title  increase DF to 10 degrees actively    Status  Achieved      PT LONG TERM GOAL #3   Title  go down stairs step over step    Status  Partially Met      PT LONG TERM GOAL #4   Title  walk without deviation    Status  Partially Met            Plan - 06/08/17 0811    Clinical Impression Statement  still very tender in PF of foot, less pain with ex today.info given to order tape    PT Treatment/Interventions  ADLs/Self Care Home Management;Cryotherapy;Electrical Stimulation;Iontophoresis 84m/ml Dexamethasone;Ultrasound;Gait training;Balance training;Therapeutic exercise;Therapeutic activities;Functional mobility training;Stair training;Patient/family education;Manual techniques;Taping;Vasopneumatic Device    PT Next Visit Plan  want to teach self taping, check goals, 10th visit       Patient will benefit from skilled therapeutic intervention in order to improve the following deficits and impairments:  Abnormal gait, Decreased range of  motion, Difficulty walking, Increased fascial restricitons, Increased muscle spasms, Decreased activity tolerance, Pain, Impaired flexibility, Increased edema, Decreased mobility  Visit Diagnosis: Pain in right foot  Difficulty in walking, not elsewhere classified  Localized edema     Problem List There are no active problems to display for this patient.   Anil Havard,ANGIE PTA 06/08/2017, 8:13 AM  [place] [address]Phone: [phone]  Fax:  [phone] Name: Denise MIDKIFFMRN: [id number] of Birth: [birth year]

## 2017-06-11 ENCOUNTER — Ambulatory Visit: Payer: BLUE CROSS/BLUE SHIELD | Admitting: Physical Therapy

## 2017-06-11 ENCOUNTER — Encounter: Payer: Self-pay | Admitting: Physical Therapy

## 2017-06-11 DIAGNOSIS — R600 Localized edema: Secondary | ICD-10-CM

## 2017-06-11 DIAGNOSIS — R262 Difficulty in walking, not elsewhere classified: Secondary | ICD-10-CM | POA: Diagnosis not present

## 2017-06-11 DIAGNOSIS — M79671 Pain in right foot: Secondary | ICD-10-CM

## 2017-06-11 NOTE — Therapy (Signed)
[address] [place], [place], [address] Phone: [phone]   Fax:  [phone]  Physical Therapy Treatment  Patient Details  Name: Denise Gibson MRN: [id number] Date of Birth: 1960-09-26 Referring Provider: Victorino December   Encounter Date: 06/11/2017  PT End of Session - 06/11/17 0846    Visit Number  10    Date for PT Re-Evaluation  06/28/17    PT Start Time  0800    PT Stop Time  0905    PT Time Calculation (min)  65 min    Activity Tolerance  Patient tolerated treatment well    Behavior During Therapy  [facility] for tasks assessed/performed       Past Medical History:  Diagnosis Date  . Breast cyst october 2012   rigth breast  . Diarrhea   . Hyperlipidemia 2017  . IBS (irritable bowel syndrome)   . Rosacea   . Tendinitis    Rt arm 2011, Lt arm 01/2012  . Vitamin D deficiency 2016  . Weight loss, unintentional     Past Surgical History:  Procedure Laterality Date  . BREAST CYST ASPIRATION Left 2011   5cc  . BREAST EXCISIONAL BIOPSY Right   . BREAST SURGERY Right 03/09/11   exc of right breast duct system   . CHOLECYSTECTOMY  03/25/11   Laparoscopy  . LAPAROSCOPIC CHOLECYSTECTOMY  2002  . VAGINAL HYSTERECTOMY  1998   DUB    There were no vitals filed for this visit.  Subjective Assessment - 06/11/17 0807    Subjective  Reports that she had 3 good days, feeling better    Currently in Pain?  Yes    Pain Score  1     Pain Location  Foot    Pain Orientation  Right                      [facility] Adult PT Treatment/Exercise - 06/11/17 0001      Electrical Stimulation   Electrical Stimulation Location  right plantar foot    Electrical Stimulation Action  IFC    Electrical Stimulation Parameters  elevated    Electrical Stimulation Goals  Edema;Pain      Vasopneumatic   Number Minutes Vasopneumatic   15 minutes    Vasopnuematic Location   Ankle    Vasopneumatic Pressure   Medium    Vasopneumatic Temperature   33      Manual Therapy   Manual Therapy  Passive ROM;Taping;Joint mobilization;Soft tissue mobilization    Soft tissue mobilization  CFM and Stripping of the plantar fascia    McConnell  Support of arch and navicular, 3 pieces      Ankle Exercises: Aerobic   Stationary Bike  Nustep L 5 5 min LE only      Ankle Exercises: Stretches   Plantar Fascia Stretch  4 reps;20 seconds prostretch    Soleus Stretch  4 reps;20 seconds    Gastroc Stretch  4 reps;20 seconds      Ankle Exercises: Standing   Other Standing Ankle Exercises  heel raises/toe raises black bar    Other Standing Ankle Exercises  calf raises leg press 30# 2 set s15      Ankle Exercises: Seated   Towel Crunch  -- multiple reps and then with 5#    Heel Raises  20 reps    Toe Raise  20 reps    Other Seated Ankle Exercises  tandem  waling               PT Short Term Goals - 05/06/17 0844      PT SHORT TERM GOAL #1   Title  independent with initial HEP    Status  Achieved        PT Long Term Goals - 06/11/17 0857      PT LONG TERM GOAL #3   Title  go down stairs step over step    Status  Partially Met      PT LONG TERM GOAL #4   Title  walk without deviation    Status  Partially Met            Plan - 06/11/17 0847    Clinical Impression Statement  Patient reports that the last treatment was very good.  She reports that the way she was taped the other day with more support for the cuneiform was better    PT Next Visit Plan  Possibly teach the tape technique, could try dry needling    Consulted and Agree with Plan of Care  Patient       Patient will benefit from skilled therapeutic intervention in order to improve the following deficits and impairments:  Abnormal gait, Decreased range of motion, Difficulty walking, Increased fascial restricitons, Increased muscle spasms, Decreased activity tolerance, Pain, Impaired flexibility, Increased edema, Decreased  mobility  Visit Diagnosis: Pain in right foot  Difficulty in walking, not elsewhere classified  Localized edema     Problem List There are no active problems to display for this patient.   Sumner Boast., PT 06/11/2017, 8:58 AM  [address] [place], [place], [address] Phone: [phone]   Fax:  [phone]  Name: Denise Gibson MRN: [id number] Date of Birth: May 20, 1961

## 2017-06-15 ENCOUNTER — Ambulatory Visit: Payer: BLUE CROSS/BLUE SHIELD | Admitting: Physical Therapy

## 2017-06-15 DIAGNOSIS — M79671 Pain in right foot: Secondary | ICD-10-CM

## 2017-06-15 DIAGNOSIS — R600 Localized edema: Secondary | ICD-10-CM

## 2017-06-15 DIAGNOSIS — R262 Difficulty in walking, not elsewhere classified: Secondary | ICD-10-CM

## 2017-06-15 NOTE — Therapy (Signed)
[place] [address] [place], [place], [address] Phone: [phone]   Fax:  [phone]  Physical Therapy Treatment  Patient Details  Name: Denise Gibson MRN: [id number] Date of Birth: 06/27/1960 Referring Provider: Victorino December   Encounter Date: 06/15/2017  PT End of Session - 06/15/17 0833    Visit Number  11    Date for PT Re-Evaluation  06/28/17    PT Start Time  0803    PT Stop Time  0850    PT Time Calculation (min)  47 min       Past Medical History:  Diagnosis Date  . Breast cyst october 2012   rigth breast  . Diarrhea   . Hyperlipidemia 2017  . IBS (irritable bowel syndrome)   . Rosacea   . Tendinitis    Rt arm 2011, Lt arm 01/2012  . Vitamin D deficiency 2016  . Weight loss, unintentional     Past Surgical History:  Procedure Laterality Date  . BREAST CYST ASPIRATION Left 2011   5cc  . BREAST EXCISIONAL BIOPSY Right   . BREAST SURGERY Right 03/09/11   exc of right breast duct system   . CHOLECYSTECTOMY  03/25/11   Laparoscopy  . LAPAROSCOPIC CHOLECYSTECTOMY  2002  . VAGINAL HYSTERECTOMY  1998   DUB    There were no vitals filed for this visit.  Subjective Assessment - 06/15/17 0826    Subjective  last 3 days have been bad, not sure why but shooting pains have returned in heel. I did get superfeet orthotics an dadvised to get better shoes    Currently in Pain?  Yes    Pain Score  6     Pain Location  Foot    Pain Orientation  Right                      [facility] Adult PT Treatment/Exercise - 06/15/17 0001      Electrical Stimulation   Electrical Stimulation Location  right plantar foot    Electrical Stimulation Action  IFC    Electrical Stimulation Parameters  elevated    Electrical Stimulation Goals  Edema;Pain      Vasopneumatic   Number Minutes Vasopneumatic   15 minutes    Vasopnuematic Location   Ankle    Vasopneumatic Pressure  Medium    Vasopneumatic  Temperature   33      Manual Therapy   Manual Therapy  Passive ROM;Taping;Joint mobilization;Soft tissue mobilization    Manual therapy comments  added medial wedge with paper towel under superf feet    Soft tissue mobilization  CFM and Stripping of the plantar fascia    McConnell  Support of arch and navicular, 3 pieces               PT Short Term Goals - 05/06/17 0844      PT SHORT TERM GOAL #1   Title  independent with initial HEP    Status  Achieved        PT Long Term Goals - 06/11/17 0857      PT LONG TERM GOAL #3   Title  go down stairs step over step    Status  Partially Met      PT LONG TERM GOAL #4   Title  walk without deviation    Status  Partially Met            Plan - 06/15/17  0833    Clinical Impression Statement  pt continues to be very tight and sensitive in PF , increased pain today with STW. added medial support under orthotic and advised new tennis with support may help    PT Treatment/Interventions  ADLs/Self Care Home Management;Cryotherapy;Electrical Stimulation;Iontophoresis 38m/ml Dexamethasone;Ultrasound;Gait training;Balance training;Therapeutic exercise;Therapeutic activities;Functional mobility training;Stair training;Patient/family education;Manual techniques;Taping;Vasopneumatic Device    PT Next Visit Plan  Dry needle       Patient will benefit from skilled therapeutic intervention in order to improve the following deficits and impairments:  Abnormal gait, Decreased range of motion, Difficulty walking, Increased fascial restricitons, Increased muscle spasms, Decreased activity tolerance, Pain, Impaired flexibility, Increased edema, Decreased mobility  Visit Diagnosis: Pain in right foot  Difficulty in walking, not elsewhere classified  Localized edema     Problem List There are no active problems to display for this patient.   ,ANGIE PTA 06/15/2017, 8:47 AM  [address] [address]Phone: [phone]  Fax:  [phone] Name: Denise BLACKLEYMRN: [id number] of Birth: 104/12/1960

## 2017-06-18 ENCOUNTER — Encounter: Payer: Self-pay | Admitting: Physical Therapy

## 2017-06-18 ENCOUNTER — Ambulatory Visit: Payer: BLUE CROSS/BLUE SHIELD | Admitting: Physical Therapy

## 2017-06-18 DIAGNOSIS — M79671 Pain in right foot: Secondary | ICD-10-CM

## 2017-06-18 DIAGNOSIS — R262 Difficulty in walking, not elsewhere classified: Secondary | ICD-10-CM | POA: Diagnosis not present

## 2017-06-18 DIAGNOSIS — R600 Localized edema: Secondary | ICD-10-CM | POA: Diagnosis not present

## 2017-06-18 NOTE — Patient Instructions (Signed)

## 2017-06-18 NOTE — Therapy (Signed)
[place] [address] [place], [place], [address] Phone: [phone]   Fax:  [phone]  Physical Therapy Treatment  Patient Details  Name: Denise Gibson MRN: [id number] Date of Birth: 01-23-1961 Referring Provider: Victorino December   Encounter Date: 06/18/2017  PT End of Session - 06/18/17 0922    Visit Number  12    Date for PT Re-Evaluation  06/28/17    PT Start Time  0844    PT Stop Time  0933    PT Time Calculation (min)  49 min    Activity Tolerance  Patient tolerated treatment well    Behavior During Therapy  [facility] for tasks assessed/performed       Past Medical History:  Diagnosis Date  . Breast cyst october 2012   rigth breast  . Diarrhea   . Hyperlipidemia 2017  . IBS (irritable bowel syndrome)   . Rosacea   . Tendinitis    Rt arm 2011, Lt arm 01/2012  . Vitamin D deficiency 2016  . Weight loss, unintentional     Past Surgical History:  Procedure Laterality Date  . BREAST CYST ASPIRATION Left 2011   5cc  . BREAST EXCISIONAL BIOPSY Right   . BREAST SURGERY Right 03/09/11   exc of right breast duct system   . CHOLECYSTECTOMY  03/25/11   Laparoscopy  . LAPAROSCOPIC CHOLECYSTECTOMY  2002  . VAGINAL HYSTERECTOMY  1998   DUB    There were no vitals filed for this visit.  Subjective Assessment - 06/18/17 0849    Subjective  Patient reports that she has continued toi have the pain and soreness, a little frustrated    Currently in Pain?  Yes    Pain Score  4     Pain Location  Foot    Pain Descriptors / Indicators  Sore    Aggravating Factors   standing and walking                      [facility] Adult PT Treatment/Exercise - 06/18/17 0001      Modalities   Modalities  Moist Heat      Moist Heat Therapy   Number Minutes Moist Heat  15 Minutes    Moist Heat Location  Ankle      Electrical Stimulation   Electrical Stimulation Location  right plantar foot    Electrical  Stimulation Action  IFC    Electrical Stimulation Parameters  elevated    Electrical Stimulation Goals  Pain      Manual Therapy   Manual Therapy  Passive ROM;Taping;Joint mobilization;Soft tissue mobilization    Soft tissue mobilization  CFM and Stripping of the plantar fascia    McConnell  Support of arch and navicular, 3 pieces       Trigger Point Dry Needling - 06/18/17 0920    Consent Given?  Yes    Education Handout Provided  Yes    Muscles Treated Lower Body  Quadratus plantae    Quadatus Plantae Response  Palpable increased muscle length FDB as well             PT Short Term Goals - 05/06/17 0844      PT SHORT TERM GOAL #1   Title  independent with initial HEP    Status  Achieved        PT Long Term Goals - 06/11/17 0857      PT LONG TERM GOAL #3  Title  go down stairs step over step    Status  Partially Met      PT LONG TERM GOAL #4   Title  walk without deviation    Status  Partially Met            Plan - 06/18/17 0922    Clinical Impression Statement  Patient did not tolerated the TrDN well, reported pain and a lot of anxiety.  No adverse effects just fear.  REports that she is unsure if the new "Superfeet" orthotics help    PT Next Visit Plan  assess the dry needling    Consulted and Agree with Plan of Care  Patient       Patient will benefit from skilled therapeutic intervention in order to improve the following deficits and impairments:  Abnormal gait, Decreased range of motion, Difficulty walking, Increased fascial restricitons, Increased muscle spasms, Decreased activity tolerance, Pain, Impaired flexibility, Increased edema, Decreased mobility  Visit Diagnosis: Pain in right foot  Difficulty in walking, not elsewhere classified     Problem List There are no active problems to display for this patient.   Sumner Boast., PT 06/18/2017, 9:25 AM  [place]  [place], [place], [address] Phone: [phone]   Fax:  [phone]  Name: Denise Gibson MRN: [id number] Date of Birth: December 21, 1960

## 2017-06-22 ENCOUNTER — Ambulatory Visit: Payer: BLUE CROSS/BLUE SHIELD | Admitting: Physical Therapy

## 2017-06-22 DIAGNOSIS — R262 Difficulty in walking, not elsewhere classified: Secondary | ICD-10-CM

## 2017-06-22 DIAGNOSIS — M79671 Pain in right foot: Secondary | ICD-10-CM | POA: Diagnosis not present

## 2017-06-22 DIAGNOSIS — R600 Localized edema: Secondary | ICD-10-CM | POA: Diagnosis not present

## 2017-06-22 NOTE — Therapy (Signed)
[address] [place], [place], [address] Phone: [phone]   Fax:  [phone]  Physical Therapy Treatment  Patient Details  Name: ALLISEN PIDGEON MRN: [id number] Date of Birth: 05-29-1960 Referring Provider: Victorino December   Encounter Date: 06/22/2017  PT End of Session - 06/22/17 0845    Visit Number  13    Date for PT Re-Evaluation  06/28/17    PT Start Time  0800    PT Stop Time  0840    PT Time Calculation (min)  40 min       Past Medical History:  Diagnosis Date  . Breast cyst october 2012   rigth breast  . Diarrhea   . Hyperlipidemia 2017  . IBS (irritable bowel syndrome)   . Rosacea   . Tendinitis    Rt arm 2011, Lt arm 01/2012  . Vitamin D deficiency 2016  . Weight loss, unintentional     Past Surgical History:  Procedure Laterality Date  . BREAST CYST ASPIRATION Left 2011   5cc  . BREAST EXCISIONAL BIOPSY Right   . BREAST SURGERY Right 03/09/11   exc of right breast duct system   . CHOLECYSTECTOMY  03/25/11   Laparoscopy  . LAPAROSCOPIC CHOLECYSTECTOMY  2002  . VAGINAL HYSTERECTOMY  1998   DUB    There were no vitals filed for this visit.  Subjective Assessment - 06/22/17 0821    Subjective  sore from DN, I fell liek I have plateaued, shooting pains coming back    Currently in Pain?  Yes    Pain Score  6     Pain Location  Foot    Pain Orientation  Right                      [facility] Adult PT Treatment/Exercise - 06/22/17 0001      Electrical Stimulation   Electrical Stimulation Location  right plantar foot    Electrical Stimulation Action  IFC    Electrical Stimulation Parameters  elevated    Electrical Stimulation Goals  Pain      Vasopneumatic   Number Minutes Vasopneumatic   15 minutes    Vasopnuematic Location   Ankle    Vasopneumatic Pressure  Medium    Vasopneumatic Temperature   33      Manual Therapy   McConnell  Support of arch and  navicular, 3 pieces             PT Education - 06/22/17 0845    Education provided  Yes    Education Details  info given on continued stretches and HEPs and going back to MD or foot specialist    Person(s) Educated  Patient    Methods  Explanation    Comprehension  Verbalized understanding       PT Short Term Goals - 05/06/17 0844      PT SHORT TERM GOAL #1   Title  independent with initial HEP    Status  Achieved        PT Long Term Goals - 06/11/17 0857      PT LONG TERM GOAL #3   Title  go down stairs step over step    Status  Partially Met      PT LONG TERM GOAL #4   Title  walk without deviation    Status  Partially Met            Plan -  06/22/17 0846    Clinical Impression Statement  pt has hit a plateau and no changes in goals or progress. rec going back to MD or foot specialist    PT Treatment/Interventions  ADLs/Self Care Home Management;Cryotherapy;Electrical Stimulation;Iontophoresis 87m/ml Dexamethasone;Ultrasound;Gait training;Balance training;Therapeutic exercise;Therapeutic activities;Functional mobility training;Stair training;Patient/family education;Manual techniques;Taping;Vasopneumatic Device    PT Next Visit Plan  HOLD PT       Patient will benefit from skilled therapeutic intervention in order to improve the following deficits and impairments:  Abnormal gait, Decreased range of motion, Difficulty walking, Increased fascial restricitons, Increased muscle spasms, Decreased activity tolerance, Pain, Impaired flexibility, Increased edema, Decreased mobility  Visit Diagnosis: Pain in right foot  Difficulty in walking, not elsewhere classified  Localized edema     Problem List There are no active problems to display for this patient.   Arthuro Canelo,ANGIE PTA 06/22/2017, 8:48 AM  [place] [address]Phone: [phone]  Fax:   [phone] Name: DSELENIA MIHOKMRN: [id number] of Birth: 1Nov 12, 1962

## 2017-06-25 ENCOUNTER — Telehealth: Payer: Self-pay | Admitting: Physical Therapy

## 2017-06-25 ENCOUNTER — Ambulatory Visit: Payer: BLUE CROSS/BLUE SHIELD | Admitting: Physical Therapy

## 2017-07-09 ENCOUNTER — Ambulatory Visit (INDEPENDENT_AMBULATORY_CARE_PROVIDER_SITE_OTHER): Payer: BLUE CROSS/BLUE SHIELD

## 2017-07-09 ENCOUNTER — Ambulatory Visit (INDEPENDENT_AMBULATORY_CARE_PROVIDER_SITE_OTHER): Payer: BLUE CROSS/BLUE SHIELD | Admitting: Podiatry

## 2017-07-09 ENCOUNTER — Encounter: Payer: Self-pay | Admitting: Podiatry

## 2017-07-09 VITALS — BP 130/86 | HR 74

## 2017-07-09 DIAGNOSIS — M722 Plantar fascial fibromatosis: Secondary | ICD-10-CM | POA: Diagnosis not present

## 2017-07-09 MED ORDER — DICLOFENAC SODIUM 75 MG PO TBEC: 75.0000 mg | DELAYED_RELEASE_TABLET | Freq: Two times a day (BID) | ORAL | 2 refills | Status: DC

## 2017-07-09 MED ORDER — TRIAMCINOLONE ACETONIDE 10 MG/ML IJ SUSP
10.0000 mg | Freq: Once | INTRAMUSCULAR | Status: AC
  Administered 2017-07-09: 10 mg

## 2017-07-09 NOTE — Progress Notes (Signed)
Subjective:   Patient ID: Denise Gibson, female   DOB: 57 y.o.   MRN: [id number]   HPI Patient presents with exquisite discomfort of the right plantar heel 6 months duration and states she is gone through physical therapy which has not been successful and is tried over-the-counter insoles and ice therapy without relief of symptoms.  Patient does not smoke and likes to be active   Review of Systems  All other systems reviewed and are negative.       Objective:  Physical Exam  Constitutional: She appears well-developed and well-nourished.  Cardiovascular: Intact distal pulses.  Pulmonary/Chest: Effort normal.  Musculoskeletal: Normal range of motion.  Neurological: She is alert.  Skin: Skin is warm.  Nursing note and vitals reviewed.   Neurovascular status intact muscle strength adequate range of motion within normal limits with patient found to have exquisite discomfort plantar aspect right heel at the insertional point of the tendon into the calcaneus with inflammation and fluid around the medial band.  Patient is noted to have good digital perfusion and is well oriented x3     Assessment:  Acute plantar fasciitis right heel medial band     Plan:  H&P and x-ray reviewed and today I injected the plantar fascia right 3 mg Kenalog 5 mg Xylocaine applied fascial brace gave instructions on physical therapy and placed on diclofenac 75 mg twice daily.  Patient will be seen back in the next several weeks and was instructed on wearing shoes at all times along with lifts in her heels  X-rays indicate spur formation with no indications of stress fracture arthritis

## 2017-07-09 NOTE — Patient Instructions (Signed)

## 2017-07-16 ENCOUNTER — Ambulatory Visit (INDEPENDENT_AMBULATORY_CARE_PROVIDER_SITE_OTHER): Payer: BLUE CROSS/BLUE SHIELD | Admitting: Podiatry

## 2017-07-16 ENCOUNTER — Encounter: Payer: Self-pay | Admitting: Podiatry

## 2017-07-16 DIAGNOSIS — M722 Plantar fascial fibromatosis: Secondary | ICD-10-CM

## 2017-07-16 NOTE — Progress Notes (Signed)
Subjective:   Patient ID: Denise Gibson, female   DOB: 57 y.o.   MRN: [id number]   HPI Patient states is doing better with my foot and states that it is gradually improving   ROS      Objective:  Physical Exam  Neurovascular status intact with significant diminishment of discomfort in the plantar heel with pain still present upon deep palpation     Assessment:  Fasciitis improving but still present     Plan:  Advised on physical therapy anti-inflammatories and patient will be seen back on an as-needed basis

## 2017-09-09 DIAGNOSIS — J301 Allergic rhinitis due to pollen: Secondary | ICD-10-CM | POA: Diagnosis not present

## 2017-09-25 ENCOUNTER — Encounter (HOSPITAL_BASED_OUTPATIENT_CLINIC_OR_DEPARTMENT_OTHER): Payer: Self-pay | Admitting: Emergency Medicine

## 2017-09-25 ENCOUNTER — Emergency Department (HOSPITAL_BASED_OUTPATIENT_CLINIC_OR_DEPARTMENT_OTHER): Payer: BLUE CROSS/BLUE SHIELD

## 2017-09-25 ENCOUNTER — Other Ambulatory Visit: Payer: Self-pay

## 2017-09-25 ENCOUNTER — Emergency Department (HOSPITAL_BASED_OUTPATIENT_CLINIC_OR_DEPARTMENT_OTHER)
Admission: EM | Admit: 2017-09-25 | Discharge: 2017-09-25 | Disposition: A | Discharge status: Home / Self Care | Payer: BLUE CROSS/BLUE SHIELD | Attending: Emergency Medicine | Admitting: Emergency Medicine

## 2017-09-25 DIAGNOSIS — Z79899 Other long term (current) drug therapy: Secondary | ICD-10-CM | POA: Diagnosis not present

## 2017-09-25 DIAGNOSIS — R05 Cough: Secondary | ICD-10-CM | POA: Diagnosis not present

## 2017-09-25 DIAGNOSIS — R112 Nausea with vomiting, unspecified: Secondary | ICD-10-CM | POA: Insufficient documentation

## 2017-09-25 DIAGNOSIS — R1013 Epigastric pain: Secondary | ICD-10-CM | POA: Insufficient documentation

## 2017-09-25 DIAGNOSIS — R110 Nausea: Secondary | ICD-10-CM

## 2017-09-25 DIAGNOSIS — R059 Cough, unspecified: Secondary | ICD-10-CM

## 2017-09-25 LAB — COMPREHENSIVE METABOLIC PANEL
ALBUMIN: 3.8 g/dL (ref 3.5–5.0)
ALT: 17 U/L (ref 14–54)
AST: 22 U/L (ref 15–41)
Alkaline Phosphatase: 91 U/L (ref 38–126)
Anion gap: 10 (ref 5–15)
BUN: 20 mg/dL (ref 6–20)
CO2: 22 mmol/L (ref 22–32)
Calcium: 8.6 mg/dL — ABNORMAL LOW (ref 8.9–10.3)
Chloride: 105 mmol/L (ref 101–111)
Creatinine, Ser: 0.56 mg/dL (ref 0.44–1.00)
GFR calc Af Amer: >60 mL/min (ref >60)
GFR calc non Af Amer: >60 mL/min (ref >60)
GLUCOSE: 123 mg/dL — AB (ref 65–99)
Potassium: 3.2 mmol/L — ABNORMAL LOW (ref 3.5–5.1)
SODIUM: 137 mmol/L (ref 135–145)
Total Bilirubin: 0.5 mg/dL (ref 0.3–1.2)
Total Protein: 7.2 g/dL (ref 6.5–8.1)

## 2017-09-25 LAB — CBC WITH DIFFERENTIAL/PLATELET
BASOS PCT: 0 %
Basophils Absolute: 0 10*3/uL (ref 0.0–0.1)
EOS PCT: 0 %
Eosinophils Absolute: 0 10*3/uL (ref 0.0–0.7)
HCT: 38.7 % (ref 36.0–46.0)
HEMOGLOBIN: 13.2 g/dL (ref 12.0–15.0)
LYMPHS ABS: 0.6 10*3/uL — AB (ref 0.7–4.0)
Lymphocytes Relative: 8 %
MCH: 29.4 pg (ref 26.0–34.0)
MCHC: 34.1 g/dL (ref 30.0–36.0)
MCV: 86.2 fL (ref 78.0–100.0)
Monocytes Absolute: 0.5 10*3/uL (ref 0.1–1.0)
Monocytes Relative: 7 %
NEUTROS PCT: 85 %
Neutro Abs: 6.3 10*3/uL (ref 1.7–7.7)
PLATELETS: 258 10*3/uL (ref 150–400)
RBC: 4.49 MIL/uL (ref 3.87–5.11)
RDW: 13.5 % (ref 11.5–15.5)
WBC: 7.4 10*3/uL (ref 4.0–10.5)

## 2017-09-25 LAB — URINALYSIS, MICROSCOPIC (REFLEX)

## 2017-09-25 LAB — URINALYSIS, ROUTINE W REFLEX MICROSCOPIC
Bilirubin Urine: NEGATIVE
Glucose, UA: NEGATIVE mg/dL
Ketones, ur: 15 mg/dL — AB
LEUKOCYTES UA: NEGATIVE
Nitrite: NEGATIVE
PH: 6 (ref 5.0–8.0)
Protein, ur: NEGATIVE mg/dL
Specific Gravity, Urine: >1.03 — ABNORMAL HIGH (ref 1.005–1.030)

## 2017-09-25 LAB — LIPASE, BLOOD: Lipase: 22 U/L (ref 11–51)

## 2017-09-25 LAB — TROPONIN I

## 2017-09-25 MED ORDER — SODIUM CHLORIDE 0.9 % IV BOLUS
1000.0000 mL | Freq: Once | INTRAVENOUS | Status: AC
  Administered 2017-09-25: 1000 mL via INTRAVENOUS

## 2017-09-25 MED ORDER — ONDANSETRON 4 MG PO TBDP: 4.0000 mg | ORAL_TABLET | Freq: Three times a day (TID) | ORAL | 0 refills | Status: DC | PRN

## 2017-09-25 MED ORDER — GI COCKTAIL ~~LOC~~
30.0000 mL | Freq: Once | ORAL | Status: AC
  Administered 2017-09-25: 30 mL via ORAL
  Filled 2017-09-25: qty 30

## 2017-09-25 MED ORDER — ONDANSETRON HCL 4 MG/2ML IJ SOLN
4.0000 mg | Freq: Once | INTRAMUSCULAR | Status: AC
  Administered 2017-09-25: 4 mg via INTRAVENOUS
  Filled 2017-09-25: qty 2

## 2017-09-25 NOTE — Discharge Instructions (Addendum)
Please follow up with your primary care doctor.  Your urine has been sent for culture, if it shows an infection that needs treatment you will be contacted.  You elected to not have CT scan today, if your symptoms worsen or you have any concerns then please seek additional medical care and evaluation.  If you change your mind and wish to have a CT scan then please return to the emergency room.  Please make sure that you are staying well-hydrated.  I have given you a prescription for Zofran to help with your nausea.  This makes some people drowsy and sleepy so please use caution while taking it.

## 2017-09-25 NOTE — ED Triage Notes (Signed)
Patient states that she has had cough x 6 weeks, she has has had intermittent Reflux. Was seen this Am for the cough and the GERD - the patient reports that she was treated for the cough, Vomited x 3 this am prior to 6 am - the patient states that she can not get rid of the nausea

## 2017-09-25 NOTE — ED Notes (Signed)
Patient is aware we need a urine sample.Will try when back from Xray

## 2017-09-25 NOTE — ED Provider Notes (Signed)
[facility] DEPARTMENT Provider Note   CSN: [id number] Arrival date & time: 09/25/17  1716     History   Chief Complaint Chief Complaint  Patient presents with  . Cough  . Nausea    HPI Denise Gibson is a 57 y.o. female with a history of IBS, hyperlipidemia, who presents today for evaluation of nausea.  She reports that she vomited approximately 3 times this morning prior to 6 AM.  She reports that she still feels very nauseous and like she needs to vomit.  She was initially seen by her primary care provider earlier today for this at [facility] walk-in clinic.  She reports that they switched her heartburn medicine from ranitidine to Prilosec and she had her first dose today.  She also reports chronic, unchanged cough for the past 6 weeks.  Devious abdominal surgeries include hysterectomy, and cholecystectomy.    HPI  Past Medical History:  Diagnosis Date  . Breast cyst october 2012   rigth breast  . Diarrhea   . Hyperlipidemia 2017  . IBS (irritable bowel syndrome)   . Rosacea   . Tendinitis    Rt arm 2011, Lt arm 01/2012  . Vitamin D deficiency 2016  . Weight loss, unintentional     There are no active problems to display for this patient.   Past Surgical History:  Procedure Laterality Date  . BREAST CYST ASPIRATION Left 2011   5cc  . BREAST EXCISIONAL BIOPSY Right   . BREAST SURGERY Right 03/09/11   exc of right breast duct system   . CHOLECYSTECTOMY  03/25/11   Laparoscopy  . LAPAROSCOPIC CHOLECYSTECTOMY  2002  . VAGINAL HYSTERECTOMY  1998   DUB     OB History    Gravida  1   Para  1   Term  1   Preterm      AB      Living  1     SAB      TAB      Ectopic      Multiple      Live Births  1            Home Medications    Prior to Admission medications   Medication Sig Start Date End Date Taking? Authorizing Provider  Acetaminophen (TYLENOL PO) Take by mouth as needed.    [provider]  Calcium  Carbonate-Vitamin D (CALCIUM 600+D PO) Take by mouth daily. Has 800mg  of vitamin D3    [provider]  Cholecalciferol (VITAMIN D3) 2000 units TABS Take 1 tablet by mouth daily.    [provider]  diclofenac (VOLTAREN) 75 MG EC tablet Take 1 tablet (75 mg total) by mouth 2 (two) times daily. 07/09/17   Regal, Tamala Fothergill, DPM  DUEXIS 800-26.6 MG TABS Take 1 tablet by mouth 3 (three) times daily. 04/08/17   [provider]  Fexofenadine HCl (ALLEGRA PO) Take by mouth as needed.      [provider]  metroNIDAZOLE (METROGEL) 1 % gel Apply 1 application topically daily.    [provider]  ondansetron (ZOFRAN ODT) 4 MG disintegrating tablet Take 1-2 tablets (4-8 mg total) by mouth every 8 (eight) hours as needed for nausea or vomiting. 09/25/17   Lorin Glass, PA-C  Ranitidine HCl (RANITIDINE ACID REDUCER PO) Take 150 mg by mouth daily.     [provider]  venlafaxine XR (EFFEXOR XR) 37.5 MG 24 hr capsule Take 1 capsule (37.5  mg total) by mouth every other day. For 4-6 weeks. Then stop medication. 01/06/17   Nunzio Cobbs, MD    Family History Family History  Problem Relation Age of Onset  . Hypertension Mother   . COPD Mother   . Fibromyalgia Mother   . Hyperlipidemia Father   . Breast cancer Maternal Grandmother     Social History Social History   Tobacco Use  . Smoking status: Never Smoker  . Smokeless tobacco: Never Used  Substance Use Topics  . Alcohol use: No    Alcohol/week: 0.0 oz  . Drug use: No     Allergies   Patient has no known allergies.   Review of Systems Review of Systems  Constitutional: Negative for chills and fever.  HENT: Negative for ear pain and sore throat.   Eyes: Negative for pain and visual disturbance.  Respiratory: Positive for cough (Chronic, unchanged.). Negative for shortness of breath.   Cardiovascular: Negative for chest pain and palpitations.  Gastrointestinal: Positive  for abdominal pain (epigastric), nausea and vomiting. Negative for constipation and diarrhea.  Genitourinary: Negative for dysuria, frequency, hematuria and urgency.  Musculoskeletal: Negative for arthralgias and back pain.  Skin: Negative for color change and rash.  Neurological: Negative for seizures, syncope and headaches.  All other systems reviewed and are negative.    Physical Exam Updated Vital Signs BP 118/84   Pulse 78   Temp 99 F (37.2 C) (Oral)   Resp 14   Ht 5\' 3"  (1.6 m)   Wt 70.3 kg (155 lb)   SpO2 97%   BMI 27.46 kg/m   Physical Exam  Constitutional: She is oriented to person, place, and time. She appears well-developed and well-nourished. No distress.  HENT:  Head: Normocephalic and atraumatic.  Mouth/Throat: Oropharynx is clear and moist.  Eyes: Conjunctivae are normal.  Neck: Normal range of motion. Neck supple.  Cardiovascular: Normal rate and regular rhythm.  No murmur heard. Pulmonary/Chest: Effort normal and breath sounds normal. No respiratory distress.  Occasional cough while in room.  Abdominal: Soft. Bowel sounds are normal. She exhibits no distension. There is tenderness (Epigastric). There is no guarding.  Musculoskeletal: She exhibits no edema.  Neurological: She is alert and oriented to person, place, and time.  Skin: Skin is warm and dry.  Psychiatric: She has a normal mood and affect.  Nursing note and vitals reviewed.    ED Treatments / Results  Labs (all labs ordered are listed, but only abnormal results are displayed) Labs Reviewed  CBC WITH DIFFERENTIAL/PLATELET - Abnormal; Notable for the following components:      Result Value   Lymphs Abs 0.6 (*)    All other components within normal limits  COMPREHENSIVE METABOLIC PANEL - Abnormal; Notable for the following components:   Potassium 3.2 (*)    Glucose, Bld 123 (*)    Calcium 8.6 (*)    All other components within normal limits  URINALYSIS, ROUTINE W REFLEX MICROSCOPIC -  Abnormal; Notable for the following components:   Specific Gravity, Urine >1.030 (*)    Hgb urine dipstick TRACE (*)    Ketones, ur 15 (*)    All other components within normal limits  URINALYSIS, MICROSCOPIC (REFLEX) - Abnormal; Notable for the following components:   Bacteria, UA MANY (*)    All other components within normal limits  URINE CULTURE  LIPASE, BLOOD  TROPONIN I    EKG EKG Interpretation  Date/Time:  Saturday Sep 25 2017 21:13:22 EDT Ventricular  Rate:  79 PR Interval:    QRS Duration: 81 QT Interval:  380 QTC Calculation: 436 R Axis:   30 Text Interpretation:  Sinus rhythm Borderline T abnormalities, anterior leads since last tracing no significant change Confirmed by Malvin Johns [phone]) on 09/25/2017 11:22:43 PM Also confirmed by Malvin Johns [phone]), editor Lynder Parents [phone])  on 09/26/2017 9:09:07 AM   Radiology Dg Chest 2 View  Result Date: 09/25/2017 CLINICAL DATA:  Cough EXAM: CHEST - 2 VIEW COMPARISON:  07/16/2004 FINDINGS: The heart size and mediastinal contours are within normal limits. Both lungs are clear. The visualized skeletal structures are unremarkable. IMPRESSION: No active cardiopulmonary disease. Electronically Signed   By: Donavan Foil M.D.   On: 09/25/2017 22:12    Procedures Procedures (including critical care time)  Medications Ordered in ED Medications  sodium chloride 0.9 % bolus 1,000 mL (0 mLs Intravenous Stopped 09/25/17 2155)  ondansetron (ZOFRAN) injection 4 mg (4 mg Intravenous Given 09/25/17 2116)  gi cocktail (Maalox,Lidocaine,Donnatal) (30 mLs Oral Given 09/25/17 2218)     Initial Impression / Assessment and Plan / ED Course  I have reviewed the triage vital signs and the nursing notes.  Pertinent labs & imaging results that were available during my care of the patient were reviewed by me and considered in my medical decision making (see chart for details).    Patient presents today for evaluation of epigastric  abdominal pain along with nausea.  She had 3 episodes of vomiting earlier today however still does not feel better.  On exam she has mild epigastric tenderness to palpation.  Labs were obtained and reviewed without significant electrolyte or hematological abnormalities.  Due to her chronic cough chest x-ray was obtained which did not show any pneumonias, consolidations, or other cause for her cough.  She was treated with saline bolus, Zofran, and GI cocktail after which she reported significant relief and resolution of her nausea.  She still had slight tenderness to palpation in the epigastric area.  We discussed the role of CT scan, and she states that she does not feel like it is necessary which I think is reasonable.  Her urine showed trace blood with many bacteria, 6-10 squamous cells.  She is not having any urinary symptoms, therefore will send for culture at this time.  Discussed empiric treatment with patient who elected to wait for culture results.  EKG and troponin were obtained due to the epigastric nature of patient's pain, both with significant acute abnormalities.  We discussed that it would be reasonable to do a delta troponin, however she declined.  GI cocktail reduced her cough, suspect it is GERD related.   Return precautions were discussed and patient stated understanding.  PCP follow up.    Final Clinical Impressions(s) / ED Diagnoses   Final diagnoses:  Nausea  Cough  Epigastric abdominal pain    ED Discharge Orders        Ordered    ondansetron (ZOFRAN ODT) 4 MG disintegrating tablet  Every 8 hours PRN     09/25/17 2336       Lorin Glass, PA-C 09/26/17 1358    Malvin Johns, MD 10/02/17 2026

## 2017-09-25 NOTE — ED Notes (Signed)
Alert, NAD, calm, interactive, resps e/u, speaking in clear complete sentences, no dyspnea noted, skin W&D, VSS, c/o cough, NV, rib pain,  (denies: CP, sob, fever, diarrhea, dizziness or visual changes).  Family at [facility].

## 2017-09-27 LAB — URINE CULTURE

## 2017-10-08 DIAGNOSIS — R1013 Epigastric pain: Secondary | ICD-10-CM | POA: Diagnosis not present

## 2017-10-08 DIAGNOSIS — R05 Cough: Secondary | ICD-10-CM | POA: Diagnosis not present

## 2017-10-08 DIAGNOSIS — R8271 Bacteriuria: Secondary | ICD-10-CM | POA: Diagnosis not present

## 2017-10-08 DIAGNOSIS — R110 Nausea: Secondary | ICD-10-CM | POA: Diagnosis not present

## 2017-11-11 ENCOUNTER — Ambulatory Visit: Payer: BLUE CROSS/BLUE SHIELD | Admitting: Podiatry

## 2017-11-15 ENCOUNTER — Telehealth: Payer: Self-pay | Admitting: Podiatry

## 2017-11-15 ENCOUNTER — Encounter: Payer: Self-pay | Admitting: Podiatry

## 2017-11-15 ENCOUNTER — Ambulatory Visit (INDEPENDENT_AMBULATORY_CARE_PROVIDER_SITE_OTHER): Payer: BLUE CROSS/BLUE SHIELD | Admitting: Podiatry

## 2017-11-15 DIAGNOSIS — M722 Plantar fascial fibromatosis: Secondary | ICD-10-CM

## 2017-11-15 MED ORDER — TRIAMCINOLONE ACETONIDE 10 MG/ML IJ SUSP
10.0000 mg | Freq: Once | INTRAMUSCULAR | Status: AC
  Administered 2017-11-15: 10 mg

## 2017-11-15 NOTE — Telephone Encounter (Signed)
Left message for pt that I could not actually talk to an agent but she did get the information that she has not met any deductible so the cost would be 398.00 either way.. I asked her to call me back to discuss further.

## 2017-11-15 NOTE — Progress Notes (Signed)
Subjective:   Patient ID: Denise Gibson, female   DOB: 57 y.o.   MRN: [id number]   HPI Patient presents stating the pain has been quite intense in the bottom of my right heel recently   ROS      Objective:  Physical Exam  Neurovascular status intact with discomfort plantar aspect right heel at the insertional point of the tendon into the calcaneus with inflammation and fluid around the medial band with depression of the arch noted     Assessment:  Acute plantar fasciitis right with inflammation fluid around the medial band     Plan:  Discussed the mechanical implications and at this point went ahead and scan for customized orthotic devices and went ahead and reinjected the plantar fascia 3 mg Kenalog 5 mg Xylocaine gave advice on shoe gear modifications and physical therapy

## 2017-11-16 NOTE — Telephone Encounter (Signed)
Left message again for pt to call to discuss orthotic coverage.

## 2017-12-02 DIAGNOSIS — L814 Other melanin hyperpigmentation: Secondary | ICD-10-CM | POA: Diagnosis not present

## 2017-12-02 DIAGNOSIS — L821 Other seborrheic keratosis: Secondary | ICD-10-CM | POA: Diagnosis not present

## 2017-12-02 DIAGNOSIS — D1801 Hemangioma of skin and subcutaneous tissue: Secondary | ICD-10-CM | POA: Diagnosis not present

## 2017-12-02 DIAGNOSIS — L905 Scar conditions and fibrosis of skin: Secondary | ICD-10-CM | POA: Diagnosis not present

## 2017-12-14 ENCOUNTER — Ambulatory Visit: Payer: BLUE CROSS/BLUE SHIELD | Admitting: Orthotics

## 2017-12-14 DIAGNOSIS — M722 Plantar fascial fibromatosis: Secondary | ICD-10-CM

## 2017-12-14 NOTE — Progress Notes (Signed)
Patient came in today to pick up custom made foot orthotics.  The goals were accomplished and the patient reported no dissatisfaction with said orthotics.  Patient was advised of breakin period and how to report any issues. 

## 2017-12-30 NOTE — Progress Notes (Signed)
57 y.o. G24P1001 Married Caucasian female here for annual exam.    Still with hot flashes. Came off Effexor.  Not so much vaginal dryness.   Night time urination.  Varies a lot.  Drinks caffeine and trying to cut down.   Can have a left breast lump that comes and goes.  Has not felt it in a while.  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   PCP:   Jeremy Johann, MD  No LMP recorded. Patient has had a hysterectomy.           Sexually active: Yes.    The current method of family planning is status post hysterectomy.    Exercising: No.  none   Smoker:  no  Health Maintenance: Pap:  2005 normal History of abnormal Pap:  no MMG:  12/08/2016 BIRADS 1: negative. [facility].  She will call.  Colonoscopy:  07/2014 benign polyps with Dr. Alferd [facility]);next due 07/2024 BMD:   n/a  Result  n/a TDaP:  2010 HIV and Hep C:12/02/2015 negative Screening Labs: done at primary care   reports that she has never smoked. She has never used smokeless tobacco. She reports that she does not drink alcohol or use drugs.  Past Medical History:  Diagnosis Date  . Breast cyst october 2012   rigth breast  . Diarrhea   . Hyperlipidemia 2017  . IBS (irritable bowel syndrome)   . Plantar fasciitis   . Rosacea   . Tendinitis    Rt arm 2011, Lt arm 01/2012  . Vitamin D deficiency 2016  . Weight loss, unintentional     Past Surgical History:  Procedure Laterality Date  . BREAST CYST ASPIRATION Left 2011   5cc  . BREAST EXCISIONAL BIOPSY Right   . BREAST SURGERY Right 03/09/11   exc of right breast duct system   . CHOLECYSTECTOMY  03/25/11   Laparoscopy  . LAPAROSCOPIC CHOLECYSTECTOMY  2002  . VAGINAL HYSTERECTOMY  1998   DUB    Current Outpatient Medications  Medication Sig Dispense Refill  . Acetaminophen (TYLENOL PO) Take by mouth as needed.    . Calcium Carbonate-Vitamin D (CALCIUM 600+D PO) Take by mouth daily. Has 800mg  of vitamin D3    . Cholecalciferol (VITAMIN D3) 2000  units TABS Take 1 tablet by mouth daily.    Marland Kitchen Fexofenadine HCl (ALLEGRA PO) Take by mouth as needed.      . fluticasone (FLONASE) 50 MCG/ACT nasal spray SPRAY 2 SPRAYS INTO EACH NOSTRIL EVERY DAY  2  . metroNIDAZOLE (METROGEL) 1 % gel Apply 1 application topically daily.    . Ranitidine HCl (RANITIDINE ACID REDUCER PO) Take 150 mg by mouth daily.      No current facility-administered medications for this visit.     Family History  Problem Relation Age of Onset  . Hypertension Mother   . COPD Mother   . Fibromyalgia Mother   . Hyperlipidemia Father   . Breast cancer Maternal Grandmother     Review of Systems  Constitutional: Negative.   HENT: Negative.   Eyes: Negative.   Respiratory: Negative.   Cardiovascular: Negative.   Gastrointestinal: Negative.   Endocrine: Negative.        Excessive thirst  Genitourinary: Negative.        Nocturia  Musculoskeletal: Positive for arthralgias and myalgias.  Skin: Negative.   Allergic/Immunologic: Negative.   Neurological: Negative.   Hematological: Negative.   Psychiatric/Behavioral: Negative.   All other systems reviewed and are negative.  Exam:   BP 126/84 (BP Location: Left Arm, Patient Position: Sitting)   Pulse 83   Resp 16   Ht 5\' 3"  (1.6 m)   Wt 150 lb (68 kg)   BMI 26.57 kg/m     General appearance: alert, cooperative and appears stated age Head: Normocephalic, without obvious abnormality, atraumatic Neck: no adenopathy, supple, symmetrical, trachea midline and thyroid normal to inspection and palpation Lungs: clear to auscultation bilaterally Breasts: normal appearance, no masses or tenderness, No nipple retraction or dimpling, No nipple discharge or bleeding, No axillary or supraclavicular adenopathy Heart: regular rate and rhythm Abdomen: soft, non-tender; no masses, no organomegaly Extremities: extremities normal, atraumatic, no cyanosis or edema Skin: Skin color, texture, turgor normal. No rashes or  lesions Lymph nodes: Cervical, supraclavicular, and axillary nodes normal. No abnormal inguinal nodes palpated Neurologic: Grossly normal  Pelvic: External genitalia:  no lesions              Urethra:  normal appearing urethra with no masses, tenderness or lesions              Bartholins and Skenes: normal                 Vagina: normal appearing vagina with normal color and discharge, no lesions              Cervix:  Absent.              Pap taken: No. Bimanual Exam:  Uterus:   absent              Adnexa: no mass, fullness, tenderness              Rectal exam: Yes.  .  Confirms.              Anus:  normal sphincter tone, no lesions  Chaperone was present for exam.  Assessment:   Well woman visit with normal exam. Status post total vaginal hysterectomy.  Ovaries remain.  Menopausal symptoms.  Side effects from Effexor so stopped. Hyperlipidemia.  Hx left breast lump that comes and goes.  Patient and I cannot confirm this today.  Plan: Mammogram screening ok. Recommended self breast awareness. Call if lump develops so I can examine.  Pap and HR HPV as above. Guidelines for Calcium, Vitamin D, regular exercise program including cardiovascular and weight bearing exercise. We discussed herbal options for menopausal symptoms.    Follow up annually and prn.   After visit summary provided.

## 2017-12-31 ENCOUNTER — Other Ambulatory Visit: Payer: Self-pay

## 2017-12-31 ENCOUNTER — Ambulatory Visit (INDEPENDENT_AMBULATORY_CARE_PROVIDER_SITE_OTHER): Payer: BLUE CROSS/BLUE SHIELD | Admitting: Obstetrics and Gynecology

## 2017-12-31 ENCOUNTER — Encounter: Payer: Self-pay | Admitting: Obstetrics and Gynecology

## 2017-12-31 VITALS — BP 126/84 | HR 83 | Resp 16 | Ht 63.0 in | Wt 150.0 lb

## 2017-12-31 DIAGNOSIS — Z01419 Encounter for gynecological examination (general) (routine) without abnormal findings: Secondary | ICD-10-CM

## 2017-12-31 NOTE — Patient Instructions (Signed)
EXERCISE AND DIET:  We recommended that you start or continue a regular exercise program for good health. Regular exercise means any activity that makes your heart beat faster and makes you sweat.  We recommend exercising at least 30 minutes per day at least 3 days a week, preferably 4 or 5.  We also recommend a diet low in fat and sugar.  Inactivity, poor dietary choices and obesity can cause diabetes, heart attack, stroke, and kidney damage, among others.    ALCOHOL AND SMOKING:  Women should limit their alcohol intake to no more than 7 drinks/beers/glasses of wine (combined, not each!) per week. Moderation of alcohol intake to this level decreases your risk of breast cancer and liver damage. And of course, no recreational drugs are part of a healthy lifestyle.  And absolutely no smoking or even second hand smoke. Most people know smoking can cause heart and lung diseases, but did you know it also contributes to weakening of your bones? Aging of your skin?  Yellowing of your teeth and nails?  CALCIUM AND VITAMIN D:  Adequate intake of calcium and Vitamin D are recommended.  The recommendations for exact amounts of these supplements seem to change often, but generally speaking 600 mg of calcium (either carbonate or citrate) and 800 units of Vitamin D per day seems prudent. Certain women may benefit from higher intake of Vitamin D.  If you are among these women, your doctor will have told you during your visit.    PAP SMEARS:  Pap smears, to check for cervical cancer or precancers,  have traditionally been done yearly, although recent scientific advances have shown that most women can have pap smears less often.  However, every woman still should have a physical exam from her gynecologist every year. It will include a breast check, inspection of the vulva and vagina to check for abnormal growths or skin changes, a visual exam of the cervix, and then an exam to evaluate the size and shape of the uterus and  ovaries.  And after 57 years of age, a rectal exam is indicated to check for rectal cancers. We will also provide age appropriate advice regarding health maintenance, like when you should have certain vaccines, screening for sexually transmitted diseases, bone density testing, colonoscopy, mammograms, etc.   MAMMOGRAMS:  All women over 40 years old should have a yearly mammogram. Many facilities now offer a "3D" mammogram, which may cost around $50 extra out of pocket. If possible,  we recommend you accept the option to have the 3D mammogram performed.  It both reduces the number of women who will be called back for extra views which then turn out to be normal, and it is better than the routine mammogram at detecting truly abnormal areas.    COLONOSCOPY:  Colonoscopy to screen for colon cancer is recommended for all women at age 50.  We know, you hate the idea of the prep.  We agree, BUT, having colon cancer and not knowing it is worse!!  Colon cancer so often starts as a polyp that can be seen and removed at colonscopy, which can quite literally save your life!  And if your first colonoscopy is normal and you have no family history of colon cancer, most women don't have to have it again for 10 years.  Once every ten years, you can do something that may end up saving your life, right?  We will be happy to help you get it scheduled when you are ready.    Be sure to check your insurance coverage so you understand how much it will cost.  It may be covered as a preventative service at no cost, but you should check your particular policy.     Menopause and Herbal Products What is menopause? Menopause is the normal time of life when menstrual periods decrease in frequency and eventually stop completely. This process can take several years for some women. Menopause is complete when you have had an absence of menstruation for a full year since your last menstrual period. It usually occurs between the ages of 48 and  55. It is not common for menopause to begin before the age of 40. During menopause, your body stops producing the female hormones estrogen and progesterone. Common symptoms associated with this loss of hormones (vasomotor symptoms) are:  Hot flashes.  Hot flushes.  Night sweats.  Other common symptoms and complications of menopause include:  Decrease in sex drive.  Vaginal dryness and thinning of the walls of the vagina. This can make sex painful.  Dryness of the skin and development of wrinkles.  Headaches.  Tiredness.  Irritability.  Memory problems.  Weight gain.  Bladder infections.  Hair growth on the face and chest.  Inability to reproduce offspring (infertility).  Loss of density in the bones (osteoporosis) increasing your risk for breaks (fractures).  Depression.  Hardening and narrowing of the arteries (atherosclerosis). This increases your risk of heart attack and stroke.  What treatment options are available? There are many treatment choices for menopause symptoms. The most common treatment is hormone replacement therapy. Many alternative therapies for menopause are emerging, including the use of herbal products. These supplements can be found in the form of herbs, teas, oils, tinctures, and pills. Common herbal supplements for menopause are made from plants that contain phytoestrogens. Phytoestrogens are compounds that occur naturally in plants and plant products. They act like estrogen in the body. Foods and herbs that contain phytoestrogens include:  Soy.  Flax seeds.  Red clover.  Ginseng.  What menopause symptoms may be helped if I use herbal products?  Vasomotor symptoms. These may be helped by: ? Soy. Some studies show that soy may have a moderate benefit for hot flashes. ? Black cohosh. There is limited evidence indicating this may be beneficial for hot flashes.  Symptoms that are related to heart and blood vessel disease. These may be  helped by soy. Studies have shown that soy can help to lower cholesterol.  Depression. This may be helped by: ? St. John's wort. There is limited evidence that shows this may help mild to moderate depression. ? Black cohosh. There is evidence that this may help depression and mood swings.  Osteoporosis. Soy may help to decrease bone loss that is associated with menopause and may prevent osteoporosis. Limited evidence indicates that red clover may offer some bone loss protection as well. Other herbal products that are commonly used during menopause lack enough evidence to support their use as a replacement for conventional menopause therapies. These products include evening primrose, ginseng, and red clover. What are the cases when herbal products should not be used during menopause? Do not use herbal products during menopause without your health care provider's approval if:  You are taking medicine.  You have a preexisting liver condition.  Are there any risks in my taking herbal products during menopause? If you choose to use herbal products to help with symptoms of menopause, keep in mind that:  Different supplements have different and unmeasured   amounts of herbal ingredients.  Herbal products are not regulated the same way that medicines are.  Concentrations of herbs may vary depending on the way they are prepared. For example, the concentration may be different in a pill, tea, oil, and tincture.  Little is known about the risks of using herbal products, particularly the risks of long-term use.  Some herbal supplements can be harmful when combined with certain medicines.  Most commonly reported side effects of herbal products are mild. However, if used improperly, many herbal supplements can cause serious problems. Talk to your health care provider before starting any herbal product. If problems develop, stop taking the supplement and let your health care provider know. This  information is not intended to replace advice given to you by your health care provider. Make sure you discuss any questions you have with your health care provider. Document Released: 10/28/2007 Document Revised: 04/07/2016 Document Reviewed: 10/24/2013 Elsevier Interactive Patient Education  2017 [employer]  

## 2018-01-18 ENCOUNTER — Other Ambulatory Visit: Payer: Self-pay | Admitting: Obstetrics and Gynecology

## 2018-01-18 DIAGNOSIS — Z1231 Encounter for screening mammogram for malignant neoplasm of breast: Secondary | ICD-10-CM

## 2018-02-11 ENCOUNTER — Ambulatory Visit
Admission: RE | Admit: 2018-02-11 | Discharge: 2018-02-11 | Disposition: A | Discharge status: Home / Self Care | Payer: BLUE CROSS/BLUE SHIELD | Source: Ambulatory Visit | Attending: Obstetrics and Gynecology | Admitting: Obstetrics and Gynecology

## 2018-02-11 DIAGNOSIS — Z1231 Encounter for screening mammogram for malignant neoplasm of breast: Secondary | ICD-10-CM

## 2018-02-16 DIAGNOSIS — R030 Elevated blood-pressure reading, without diagnosis of hypertension: Secondary | ICD-10-CM | POA: Diagnosis not present

## 2018-02-16 DIAGNOSIS — R05 Cough: Secondary | ICD-10-CM | POA: Diagnosis not present

## 2018-02-23 DIAGNOSIS — S39012A Strain of muscle, fascia and tendon of lower back, initial encounter: Secondary | ICD-10-CM | POA: Diagnosis not present

## 2018-04-15 ENCOUNTER — Encounter: Payer: Self-pay | Admitting: Podiatry

## 2018-04-15 ENCOUNTER — Ambulatory Visit: Payer: BLUE CROSS/BLUE SHIELD | Admitting: Podiatry

## 2018-04-15 DIAGNOSIS — M722 Plantar fascial fibromatosis: Secondary | ICD-10-CM

## 2018-04-15 MED ORDER — TRIAMCINOLONE ACETONIDE 10 MG/ML IJ SUSP
10.0000 mg | Freq: Once | INTRAMUSCULAR | Status: AC
  Administered 2018-04-15: 10 mg

## 2018-04-17 NOTE — Progress Notes (Signed)
Subjective:   Patient ID: Denise Gibson, female   DOB: 57 y.o.   MRN: [id number]   HPI Patient presents stating she is had a lot of pain in her right heel and its been intensified recently and she did well with previous treatment and is worse when she gets up on the morning after periods of sitting   ROS      Objective:  Physical Exam  Acute fasciitis of the right heel with inflammation fluid around the medial band     Assessment:  Acute plantar fasciitis with inflammation directly of the insertion calcaneus     Plan:  Reviewed physical therapy anti-inflammatories and injected the plantar fascia right 3 mg Kenalog 5 mg Xylocaine and dispensed night splint with all instructions on usage.  Reappoint to recheck again in the next 4 weeks

## 2018-08-05 DIAGNOSIS — L218 Other seborrheic dermatitis: Secondary | ICD-10-CM | POA: Diagnosis not present

## 2018-08-05 DIAGNOSIS — D225 Melanocytic nevi of trunk: Secondary | ICD-10-CM | POA: Diagnosis not present

## 2018-08-05 DIAGNOSIS — L718 Other rosacea: Secondary | ICD-10-CM | POA: Diagnosis not present

## 2019-01-02 ENCOUNTER — Other Ambulatory Visit: Payer: Self-pay | Admitting: Obstetrics and Gynecology

## 2019-01-02 DIAGNOSIS — Z1231 Encounter for screening mammogram for malignant neoplasm of breast: Secondary | ICD-10-CM

## 2019-01-18 NOTE — Progress Notes (Signed)
58 y.o. G40P1001 Married Caucasian female here for annual exam.    Has hot flashes still all day long.  She is considering herbal therapy.  Sex can be slightly dry, but manageable.   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    PCP:   Carol Ada, MD  No LMP recorded. Patient has had a hysterectomy.           Sexually active: Yes.    The current method of family planning is status post hysterectomy.    Exercising: Yes.    walking with job Smoker:  no  Health Maintenance: Pap: 2005 normal History of abnormal Pap:  no MMG: 02-11-18 Neg/density C/Birads1--appt. 02-16-19 Colonoscopy:  07/2014 benign polyps;next due 07/2024 BMD:  none Result  n/a TDaP: 2010 Gardasil:   n/a HIV:12-02-15 Neg Hep C: 12-02-15 Neg Screening Labs:  PCP.   reports that she has never smoked. She has never used smokeless tobacco. She reports that she does not drink alcohol or use drugs.  Past Medical History:  Diagnosis Date  . Breast cyst october 2012   rigth breast  . Diarrhea   . Hyperlipidemia 2017  . IBS (irritable bowel syndrome)   . Plantar fasciitis   . Rosacea   . Tendinitis    Rt arm 2011, Lt arm 01/2012  . Vitamin D deficiency 2016  . Weight loss, unintentional     Past Surgical History:  Procedure Laterality Date  . BREAST CYST ASPIRATION Left 2011   5cc  . BREAST EXCISIONAL BIOPSY Right   . BREAST SURGERY Right 03/09/11   exc of right breast duct system   . CHOLECYSTECTOMY  03/25/11   Laparoscopy  . LAPAROSCOPIC CHOLECYSTECTOMY  2002  . VAGINAL HYSTERECTOMY  1998   DUB    Current Outpatient Medications  Medication Sig Dispense Refill  . Acetaminophen (TYLENOL PO) Take by mouth as needed.    . Calcium Carbonate-Vitamin D (CALCIUM 600+D PO) Take by mouth daily. Has 800mg  of vitamin D3    . Cholecalciferol (VITAMIN D3) 2000 units TABS Take 1 tablet by mouth daily.    Marland Kitchen doxycycline (VIBRAMYCIN) 50 MG capsule Take 50 mg by mouth 2 (two) times daily.    . famotidine (PEPCID) 20 MG tablet  Take 20 mg by mouth 2 (two) times daily.    Marland Kitchen Fexofenadine HCl (ALLEGRA PO) Take by mouth as needed.      . fluticasone (FLONASE) 50 MCG/ACT nasal spray SPRAY 2 SPRAYS INTO EACH NOSTRIL EVERY DAY  2  . metroNIDAZOLE (METROGEL) 1 % gel Apply 1 application topically daily.     No current facility-administered medications for this visit.     Family History  Problem Relation Age of Onset  . Hypertension Mother   . COPD Mother   . Fibromyalgia Mother   . Hyperlipidemia Father   . Breast cancer Maternal Grandmother     Review of Systems  Constitutional: Negative.   HENT: Negative.   Eyes: Negative.   Respiratory: Negative.   Cardiovascular: Negative.   Gastrointestinal: Negative.   Endocrine: Negative.   Genitourinary: Negative.   Musculoskeletal: Negative.   Skin: Negative.   Allergic/Immunologic: Negative.   Neurological: Negative.   Hematological: Negative.   Psychiatric/Behavioral: Negative.     Exam:   BP 118/72   Pulse 68   Temp (!) 97.2 F (36.2 C) (Skin)   Resp 16   Ht 5\' 3"  (1.6 m)   Wt 158 lb (71.7 kg)   BMI 27.99 kg/m  General appearance: alert, cooperative and appears stated age Head: normocephalic, without obvious abnormality, atraumatic Neck: no adenopathy, supple, symmetrical, trachea midline and thyroid normal to inspection and palpation Lungs: clear to auscultation bilaterally Breasts: normal appearance, no masses or tenderness, No nipple retraction or dimpling, No nipple discharge or bleeding, No axillary adenopathy Heart: regular rate and rhythm Abdomen: soft, non-tender; no masses, no organomegaly Extremities: extremities normal, atraumatic, no cyanosis or edema Skin: skin color, texture, turgor normal. No rashes or lesions Lymph nodes: cervical, supraclavicular, and axillary nodes normal. Neurologic: grossly normal  Pelvic: External genitalia:  no lesions              No abnormal inguinal nodes palpated.              Urethra:  normal  appearing urethra with no masses, tenderness or lesions              Bartholins and Skenes: normal                 Vagina: normal appearing vagina with normal color and discharge, no lesions              Cervix;  absent              Pap taken: No. Bimanual Exam:  Uterus:   absent              Adnexa: no mass, fullness, tenderness              Rectal exam: Yes.  .  Confirms.              Anus:  normal sphincter tone, no lesions  Chaperone was present for exam.  Assessment:   Well woman visit with normal exam. Status post total vaginal hysterectomy.  Ovaries remain.  Menopausal symptoms. Side effects from Effexor, so stopped. Hyperlipidemia.  Plan: Mammogram screening discussed. Self breast awareness reviewed. Pap and HR HPV as above. Guidelines for Calcium, Vitamin D, regular exercise program including cardiovascular and weight bearing exercise. We discussed Neurontin and Paxil as alternatives.  TDap.  Labs with PCP.   Follow up annually and prn.   After visit summary provided.

## 2019-01-20 ENCOUNTER — Encounter: Payer: Self-pay | Admitting: Obstetrics and Gynecology

## 2019-01-20 ENCOUNTER — Other Ambulatory Visit: Payer: Self-pay

## 2019-01-20 ENCOUNTER — Ambulatory Visit: Payer: BC Managed Care – PPO | Admitting: Obstetrics and Gynecology

## 2019-01-20 VITALS — BP 118/72 | HR 68 | Temp 97.2°F | Resp 16 | Ht 63.0 in | Wt 158.0 lb

## 2019-01-20 DIAGNOSIS — Z01419 Encounter for gynecological examination (general) (routine) without abnormal findings: Secondary | ICD-10-CM | POA: Diagnosis not present

## 2019-01-20 DIAGNOSIS — Z23 Encounter for immunization: Secondary | ICD-10-CM

## 2019-01-20 NOTE — Patient Instructions (Signed)

## 2019-01-23 IMAGING — CR DG CHEST 2V
2 series · 2 of 2 positions shown · non-contrast
Comparison: 07/16/2004

CLINICAL DATA: Cough

EXAM:
CHEST - 2 VIEW

[w chest pa]
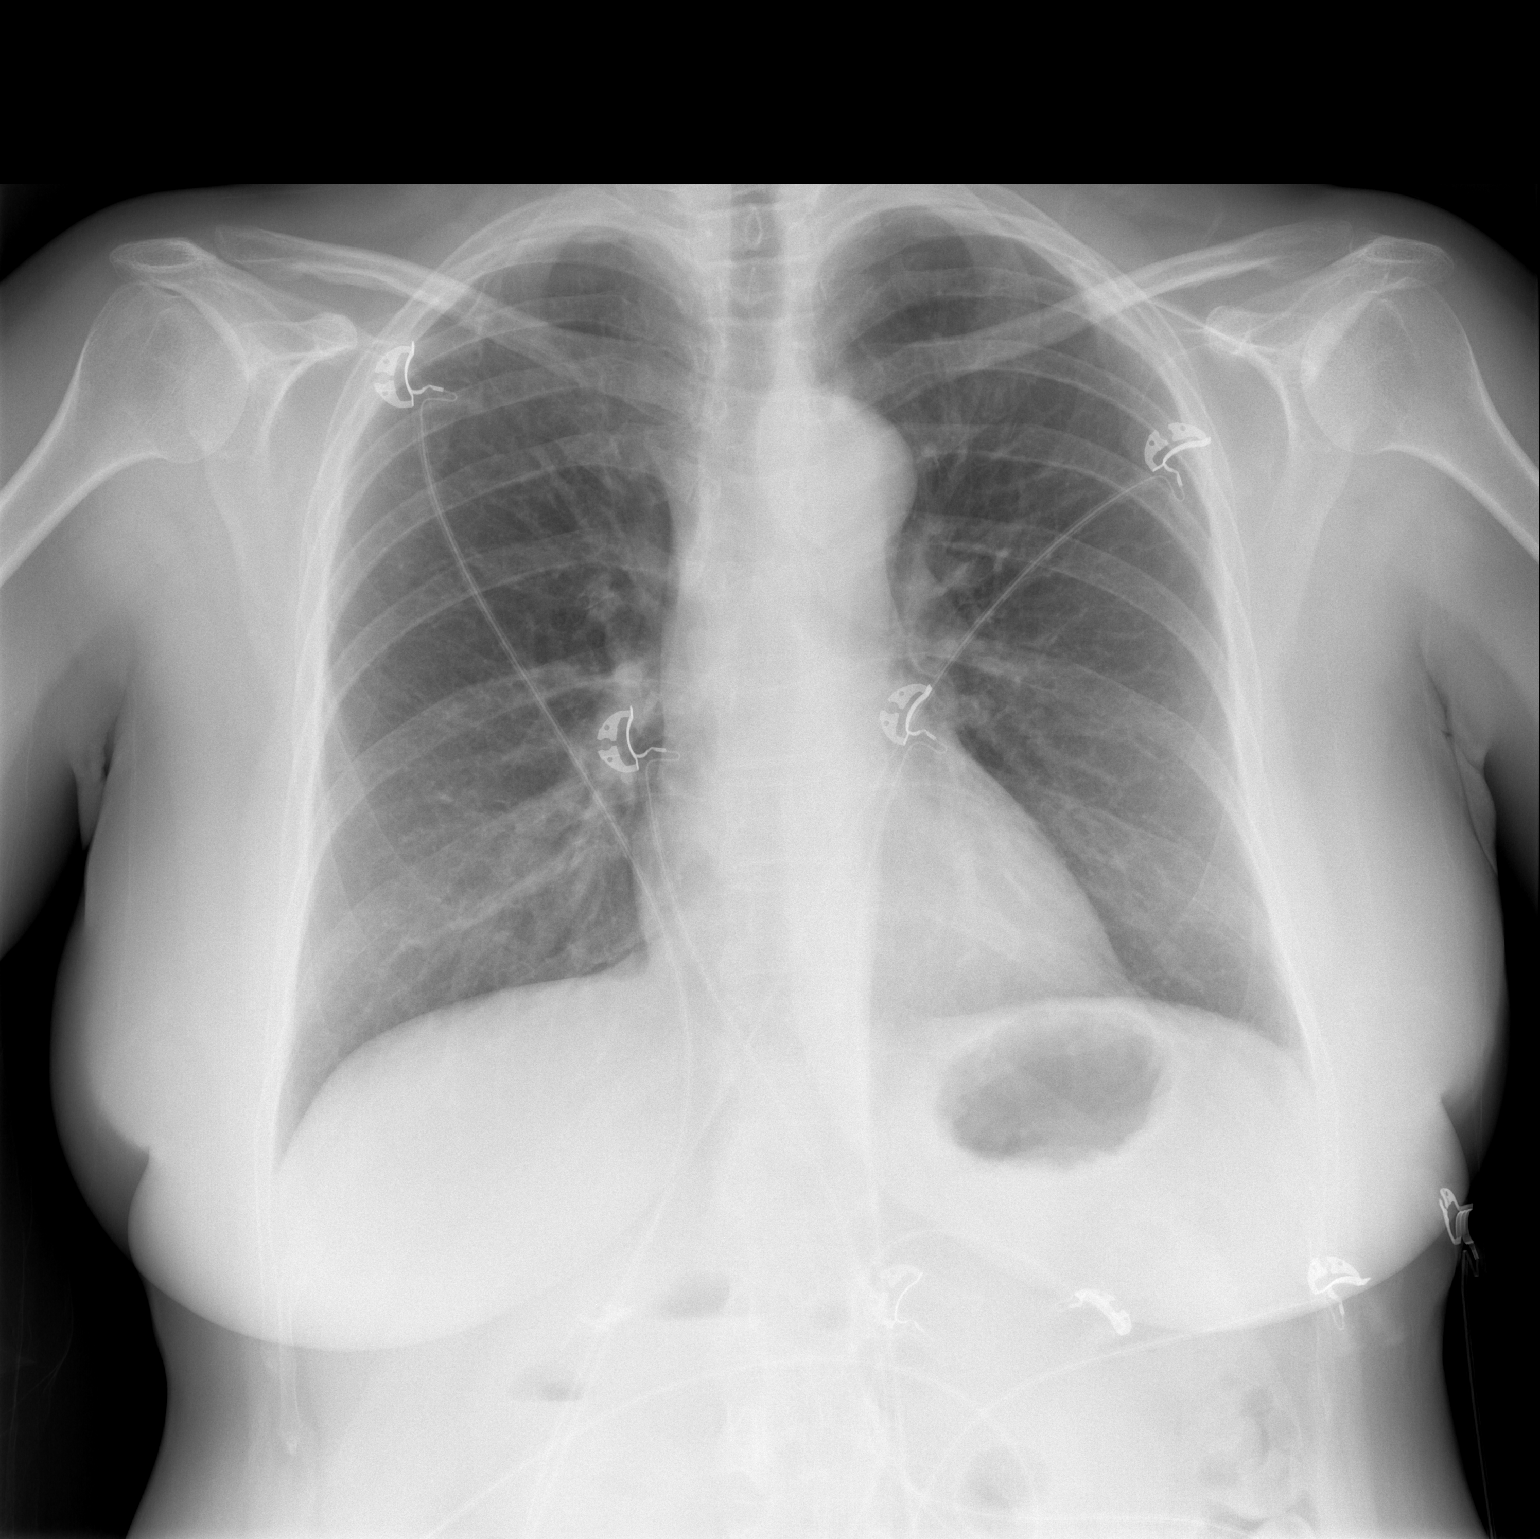

[w chest lat]
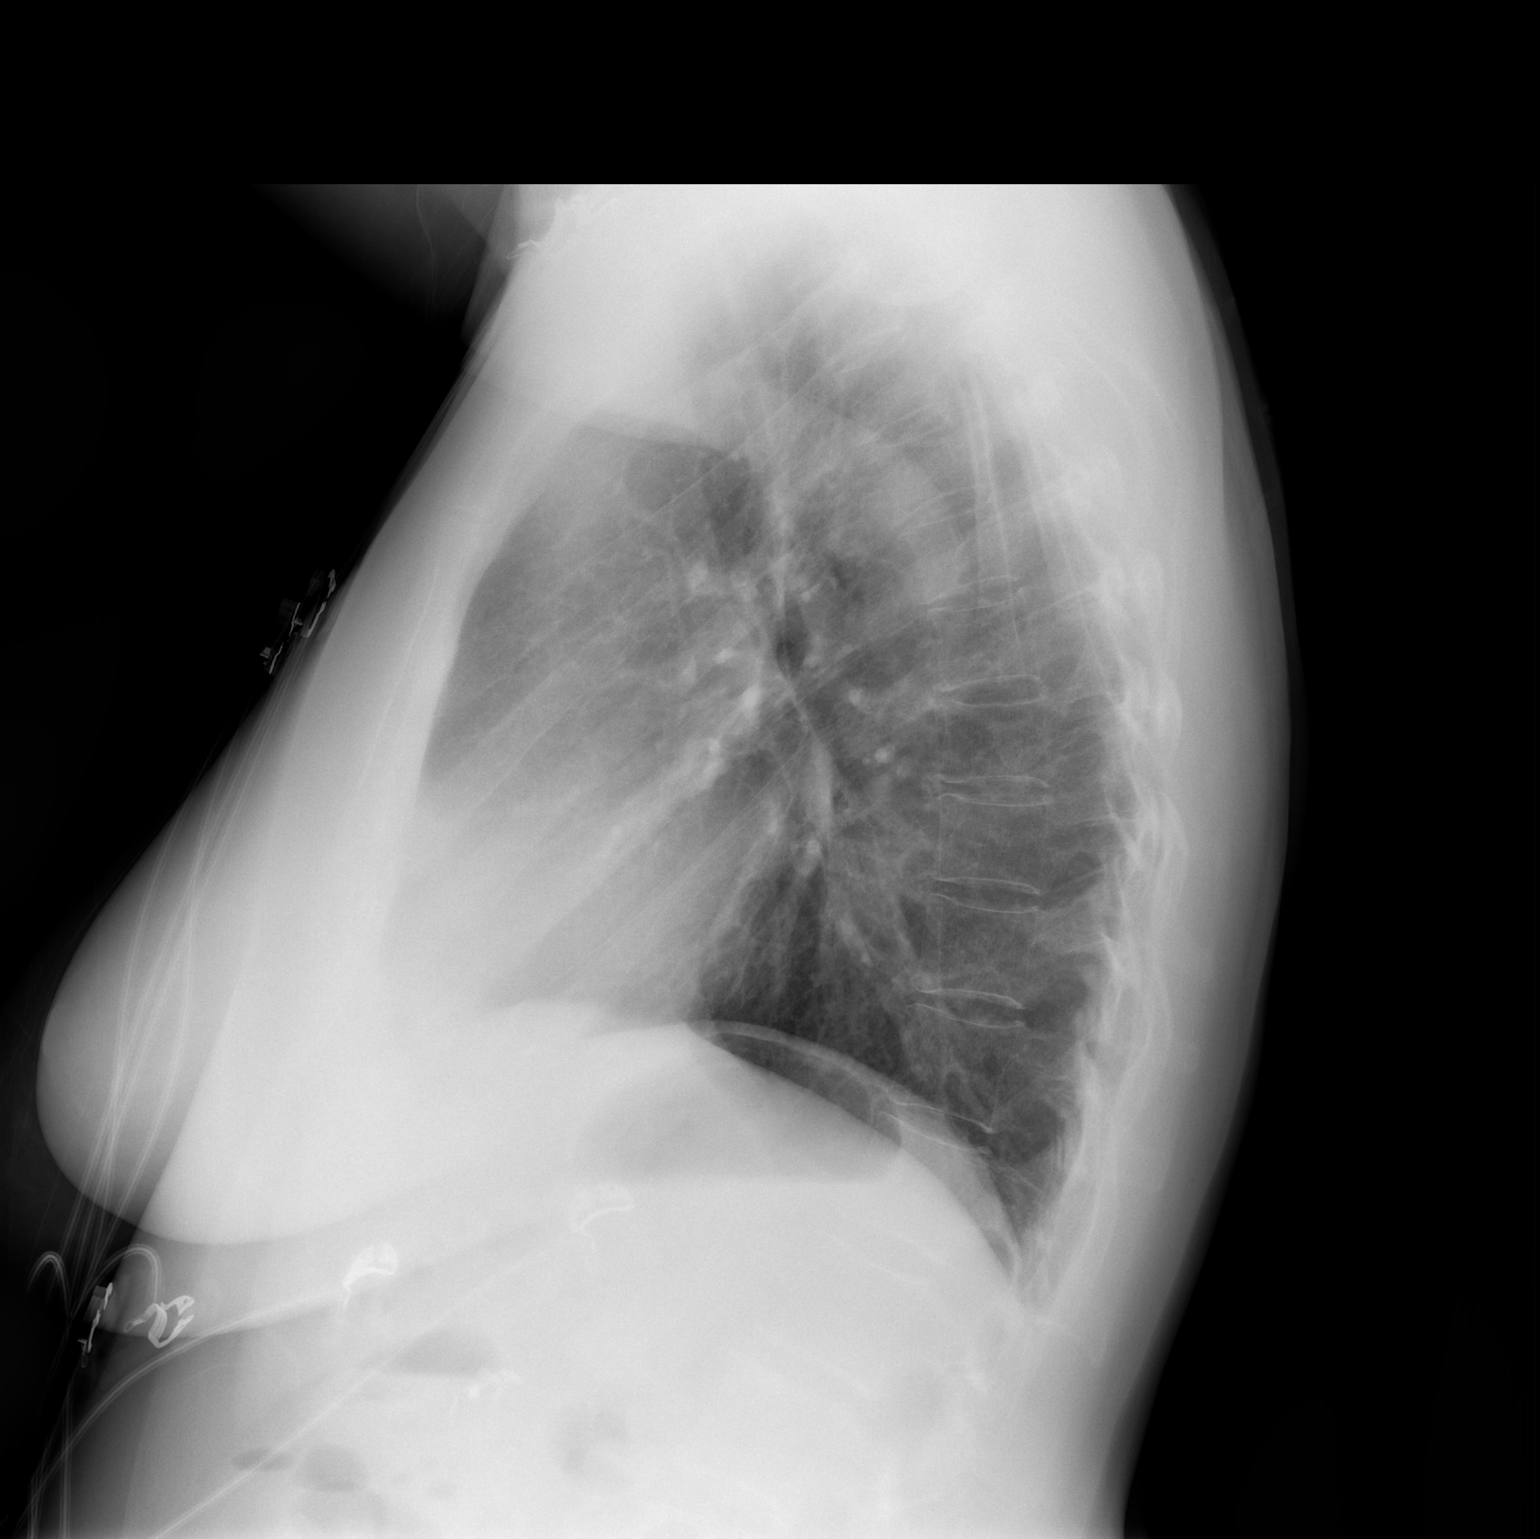

[2 of 2 positions shown; findings below may reference images not displayed]

FINDINGS: The heart size and mediastinal contours are within normal limits.
Both lungs are clear. The visualized skeletal structures are
unremarkable.
IMPRESSION: No active cardiopulmonary disease.

## 2019-01-24 DIAGNOSIS — H43811 Vitreous degeneration, right eye: Secondary | ICD-10-CM | POA: Diagnosis not present

## 2019-02-16 ENCOUNTER — Ambulatory Visit
Admission: RE | Admit: 2019-02-16 | Discharge: 2019-02-16 | Disposition: A | Discharge status: Home / Self Care | Payer: BLUE CROSS/BLUE SHIELD | Source: Ambulatory Visit | Attending: Obstetrics and Gynecology | Admitting: Obstetrics and Gynecology

## 2019-02-16 ENCOUNTER — Other Ambulatory Visit: Payer: Self-pay

## 2019-02-16 DIAGNOSIS — Z1231 Encounter for screening mammogram for malignant neoplasm of breast: Secondary | ICD-10-CM

## 2019-04-06 ENCOUNTER — Emergency Department (HOSPITAL_BASED_OUTPATIENT_CLINIC_OR_DEPARTMENT_OTHER): Payer: BC Managed Care – PPO

## 2019-04-06 ENCOUNTER — Emergency Department (HOSPITAL_BASED_OUTPATIENT_CLINIC_OR_DEPARTMENT_OTHER)
Admission: EM | Admit: 2019-04-06 | Discharge: 2019-04-06 | Disposition: A | Discharge status: Home / Self Care | Payer: BC Managed Care – PPO | Attending: Emergency Medicine | Admitting: Emergency Medicine

## 2019-04-06 ENCOUNTER — Other Ambulatory Visit: Payer: Self-pay

## 2019-04-06 DIAGNOSIS — E785 Hyperlipidemia, unspecified: Secondary | ICD-10-CM | POA: Diagnosis not present

## 2019-04-06 DIAGNOSIS — S5291XA Unspecified fracture of right forearm, initial encounter for closed fracture: Secondary | ICD-10-CM | POA: Insufficient documentation

## 2019-04-06 DIAGNOSIS — W109XXA Fall (on) (from) unspecified stairs and steps, initial encounter: Secondary | ICD-10-CM | POA: Insufficient documentation

## 2019-04-06 DIAGNOSIS — S42401A Unspecified fracture of lower end of right humerus, initial encounter for closed fracture: Secondary | ICD-10-CM | POA: Diagnosis not present

## 2019-04-06 DIAGNOSIS — M25421 Effusion, right elbow: Secondary | ICD-10-CM | POA: Diagnosis not present

## 2019-04-06 DIAGNOSIS — S299XXA Unspecified injury of thorax, initial encounter: Secondary | ICD-10-CM | POA: Diagnosis not present

## 2019-04-06 DIAGNOSIS — Y92008 Other place in unspecified non-institutional (private) residence as the place of occurrence of the external cause: Secondary | ICD-10-CM | POA: Insufficient documentation

## 2019-04-06 DIAGNOSIS — Y9301 Activity, walking, marching and hiking: Secondary | ICD-10-CM | POA: Insufficient documentation

## 2019-04-06 DIAGNOSIS — S29011A Strain of muscle and tendon of front wall of thorax, initial encounter: Secondary | ICD-10-CM | POA: Diagnosis not present

## 2019-04-06 DIAGNOSIS — Y999 Unspecified external cause status: Secondary | ICD-10-CM | POA: Diagnosis not present

## 2019-04-06 DIAGNOSIS — R0789 Other chest pain: Secondary | ICD-10-CM | POA: Diagnosis not present

## 2019-04-06 DIAGNOSIS — Z79899 Other long term (current) drug therapy: Secondary | ICD-10-CM | POA: Diagnosis not present

## 2019-04-06 DIAGNOSIS — W19XXXA Unspecified fall, initial encounter: Secondary | ICD-10-CM

## 2019-04-06 MED ORDER — ACETAMINOPHEN 500 MG PO TABS
1000.0000 mg | ORAL_TABLET | Freq: Once | ORAL | Status: AC
  Administered 2019-04-06: 1000 mg via ORAL
  Filled 2019-04-06: qty 2

## 2019-04-06 MED ORDER — IBUPROFEN 400 MG PO TABS
400.0000 mg | ORAL_TABLET | Freq: Once | ORAL | Status: AC
  Administered 2019-04-06: 10:00:00 400 mg via ORAL
  Filled 2019-04-06: qty 1

## 2019-04-06 NOTE — Discharge Instructions (Signed)
No lifting with the right arm and wear the sling and splint until removed by the orthopedist.  Take Tylenol and ibuprofen as needed for pain.

## 2019-04-06 NOTE — ED Provider Notes (Signed)
[facility] EMERGENCY DEPARTMENT Provider Note   CSN: [id number] Arrival date & time: 04/06/19  [id number]     History   Chief Complaint Chief Complaint  Patient presents with  . Fall  . Chest Pain    HPI Denise Gibson is a 58 y.o. female.     The history is provided by the patient.  Fall This is a new problem. The current episode started 1 to 2 hours ago. The problem occurs constantly. The problem has not changed since onset.Associated symptoms include chest pain. Associated symptoms comments: Right elbow pain and mild pain to the right shin.  Patient was coming down her hardwood steps when her foot got tangled and she fell down 5 steps.  She denies any head injury or loss of consciousness.  She had no dizziness, syncope, heart palpitations or chest pain prior to the event.  She was able to stand and walk without difficulty.  She has no abdominal, pelvic or knee pain.  She has severe 10 out of 10 pain when she attempts to move her right elbow but no wrist or hand pain.  No numbness or tingling in the right hand.  Also having pain in the right side of the chest but no shortness of breath.  Pain is worse with deep inspiration.. The symptoms are aggravated by bending and twisting. The symptoms are relieved by rest. She has tried rest (ice) for the symptoms. The treatment provided no relief.  Chest Pain   Past Medical History:  Diagnosis Date  . Breast cyst october 2012   rigth breast  . Diarrhea   . Hyperlipidemia 2017  . IBS (irritable bowel syndrome)   . Plantar fasciitis   . Rosacea   . Tendinitis    Rt arm 2011, Lt arm 01/2012  . Vitamin D deficiency 2016  . Weight loss, unintentional     There are no active problems to display for this patient.   Past Surgical History:  Procedure Laterality Date  . BREAST CYST ASPIRATION Left 2011   5cc  . BREAST EXCISIONAL BIOPSY Right   . BREAST SURGERY Right 03/09/11   exc of right breast duct system   .  CHOLECYSTECTOMY  03/25/11   Laparoscopy  . LAPAROSCOPIC CHOLECYSTECTOMY  2002  . VAGINAL HYSTERECTOMY  1998   DUB     OB History    Gravida  1   Para  1   Term  1   Preterm      AB      Living  1     SAB      TAB      Ectopic      Multiple      Live Births  1            Home Medications    Prior to Admission medications   Medication Sig Start Date End Date Taking? Authorizing Provider  Acetaminophen (TYLENOL PO) Take by mouth as needed.    [provider]  Calcium Carbonate-Vitamin D (CALCIUM 600+D PO) Take by mouth daily. Has 800mg  of vitamin D3    [provider]  Cholecalciferol (VITAMIN D3) 2000 units TABS Take 1 tablet by mouth daily.    [provider]  doxycycline (VIBRAMYCIN) 50 MG capsule Take 50 mg by mouth 2 (two) times daily. 12/24/18   [provider]  famotidine (PEPCID) 20 MG tablet Take 20 mg by mouth 2 (two) times daily.    [provider]  Fexofenadine HCl (ALLEGRA PO) Take by mouth as needed.      [provider]  fluticasone (FLONASE) 50 MCG/ACT nasal spray SPRAY 2 SPRAYS INTO EACH NOSTRIL EVERY DAY 10/10/17   [provider]  metroNIDAZOLE (METROGEL) 1 % gel Apply 1 application topically daily.    [provider]    Family History Family History  Problem Relation Age of Onset  . Hypertension Mother   . COPD Mother   . Fibromyalgia Mother   . Hyperlipidemia Father   . Breast cancer Maternal Grandmother     Social History Social History   Tobacco Use  . Smoking status: Never Smoker  . Smokeless tobacco: Never Used  Substance Use Topics  . Alcohol use: No    Alcohol/week: 0.0 standard drinks  . Drug use: No     Allergies   Patient has no known allergies.   Review of Systems Review of Systems  Cardiovascular: Positive for chest pain.  All other systems reviewed and are negative.    Physical Exam Updated Vital Signs BP (!) 166/94 (BP Location:  Right Arm)   Pulse 82   Temp 98.8 F (37.1 C) (Oral)   Resp 18   Ht 5\' 3"  (1.6 m)   Wt 72.6 kg   SpO2 100%   BMI 28.34 kg/m   Physical Exam Vitals signs and nursing note reviewed.  Constitutional:      General: She is not in acute distress.    Appearance: She is well-developed and normal weight.  HENT:     Head: Normocephalic and atraumatic.     Nose: Nose normal.     Mouth/Throat:     Mouth: Mucous membranes are moist.  Eyes:     Pupils: Pupils are equal, round, and reactive to light.  Neck:     Musculoskeletal: Normal range of motion and neck supple. No muscular tenderness.  Cardiovascular:     Rate and Rhythm: Normal rate and regular rhythm.     Heart sounds: Normal heart sounds. No murmur. No friction rub.  Pulmonary:     Effort: Pulmonary effort is normal.     Breath sounds: Normal breath sounds. No wheezing or rales.     Comments: No clavicle tenderness Chest:     Chest wall: Tenderness present.    Abdominal:     General: Bowel sounds are normal. There is no distension.     Palpations: Abdomen is soft.     Tenderness: There is no abdominal tenderness. There is no guarding or rebound.  Musculoskeletal:        General: Tenderness present. No deformity.     Right elbow: She exhibits decreased range of motion and swelling. Tenderness found. Radial head tenderness noted. No olecranon process tenderness noted.     Right hip: Normal.     Left hip: Normal.     Right lower leg: No edema.     Left lower leg: No edema.       Legs:     Comments: No edema  Skin:    General: Skin is warm and dry.     Findings: No rash.  Neurological:     General: No focal deficit present.     Mental Status: She is alert and oriented to person, place, and time. Mental status is at baseline.     Cranial Nerves: No cranial nerve deficit.     Comments: Normal sensation in the right hand, normal finger opposition.  5 out of 5 hand grip strength  on the right  Psychiatric:        Mood and  Affect: Mood normal.        Behavior: Behavior normal.        Thought Content: Thought content normal.      ED Treatments / Results  Labs (all labs ordered are listed, but only abnormal results are displayed) Labs Reviewed - No data to display  EKG None  Radiology Dg Ribs Unilateral W/chest Right  Result Date: 04/06/2019 CLINICAL DATA:  Right chest pain after fall. EXAM: RIGHT RIBS AND CHEST - 3+ VIEW COMPARISON:  Chest x-ray 09/26/2017. FINDINGS: Mediastinum hilar structures normal. Lungs are clear. No pleural effusion or pneumothorax. No evidence of displaced fracture. Surgical clips right upper quadrant. IMPRESSION: No evidence of displaced fracture or pneumothorax. No acute cardiopulmonary disease identified. Electronically Signed   By: Marcello Moores  Register   On: 04/06/2019 10:18   Dg Elbow Complete Right  Result Date: 04/06/2019 CLINICAL DATA:  Severe right elbow pain due to an injury suffered in a fall this morning. EXAM: RIGHT ELBOW - COMPLETE 3+ VIEW COMPARISON:  None. FINDINGS: The patient has an elbow joint effusion. No displaced fracture is identified. The elbow is located. IMPRESSION: Negative for displaced fracture. Elbow joint effusion is consistent with a radiographically occult fracture or bone contusion. Electronically Signed   By: Inge Rise M.D.   On: 04/06/2019 10:19    Procedures Procedures (including critical care time)  Medications Ordered in ED Medications  ibuprofen (ADVIL) tablet 400 mg (has no administration in time range)  acetaminophen (TYLENOL) tablet 1,000 mg (has no administration in time range)     Initial Impression / Assessment and Plan / ED Course  I have reviewed the triage vital signs and the nursing notes.  Pertinent labs & imaging results that were available during my care of the patient were reviewed by me and considered in my medical decision making (see chart for details).        Patient with a mechanical fall down the steps  now complaining of right-sided chest wall tenderness and right elbow pain.  She had no head injury or loss of consciousness and patient takes no anticoagulation.  She has a GCS of 15 and is awake and alert.  She was able to ambulate without difficulty.  Imaging of the right elbow and chest x-ray pending.  10:34 AM cxr wnl.  Elbow film is positive for joint effusion with anterior and posterior fat pad and concern for possible occult fracture.  Patient was placed in a long-arm splint and sling.  She will follow-up with Dr. Apolonio Schneiders who is her orthopedist.  Final Clinical Impressions(s) / ED Diagnoses   Final diagnoses:  Fall, initial encounter  Muscle strain of chest wall, initial encounter  Closed fracture of right elbow, initial encounter    ED Discharge Orders    None       Blanchie Dessert, MD 04/06/19 1037

## 2019-04-06 NOTE — ED Triage Notes (Signed)
Pt reports trip and fall down 5 steps onto hardwood floor, denies head injury or loc. Pt cc is pain to right elbow pain, 10/10 with movement, 7/10 at rest, chest pain 5/10, "soreness" denies sob, pain to chest increases with deep inspiration, non tender to palp, ekg performed during triage. Ring to left ring finger attempting to be removed during triage.

## 2019-04-11 DIAGNOSIS — M25521 Pain in right elbow: Secondary | ICD-10-CM | POA: Diagnosis not present

## 2019-04-11 DIAGNOSIS — S52124A Nondisplaced fracture of head of right radius, initial encounter for closed fracture: Secondary | ICD-10-CM | POA: Diagnosis not present

## 2019-05-04 DIAGNOSIS — S52124A Nondisplaced fracture of head of right radius, initial encounter for closed fracture: Secondary | ICD-10-CM | POA: Diagnosis not present

## 2019-05-04 DIAGNOSIS — M25521 Pain in right elbow: Secondary | ICD-10-CM | POA: Diagnosis not present

## 2019-05-26 DIAGNOSIS — Z8616 Personal history of COVID-19: Secondary | ICD-10-CM

## 2019-05-26 HISTORY — DX: Personal history of COVID-19: Z86.16

## 2019-06-01 ENCOUNTER — Ambulatory Visit: Payer: BC Managed Care – PPO | Attending: Internal Medicine

## 2019-06-01 DIAGNOSIS — Z20822 Contact with and (suspected) exposure to covid-19: Secondary | ICD-10-CM

## 2019-06-03 LAB — NOVEL CORONAVIRUS, NAA: SARS-CoV-2, NAA: DETECTED — AB

## 2019-06-13 DIAGNOSIS — S52124D Nondisplaced fracture of head of right radius, subsequent encounter for closed fracture with routine healing: Secondary | ICD-10-CM | POA: Diagnosis not present

## 2019-06-21 DIAGNOSIS — M25521 Pain in right elbow: Secondary | ICD-10-CM | POA: Diagnosis not present

## 2019-06-29 DIAGNOSIS — M25521 Pain in right elbow: Secondary | ICD-10-CM | POA: Diagnosis not present

## 2019-07-05 DIAGNOSIS — M25521 Pain in right elbow: Secondary | ICD-10-CM | POA: Diagnosis not present

## 2019-07-11 DIAGNOSIS — S52124D Nondisplaced fracture of head of right radius, subsequent encounter for closed fracture with routine healing: Secondary | ICD-10-CM | POA: Diagnosis not present

## 2019-07-11 DIAGNOSIS — M25521 Pain in right elbow: Secondary | ICD-10-CM | POA: Diagnosis not present

## 2019-07-19 DIAGNOSIS — M25521 Pain in right elbow: Secondary | ICD-10-CM | POA: Diagnosis not present

## 2019-07-26 DIAGNOSIS — M25521 Pain in right elbow: Secondary | ICD-10-CM | POA: Diagnosis not present

## 2019-08-04 DIAGNOSIS — M25521 Pain in right elbow: Secondary | ICD-10-CM | POA: Diagnosis not present

## 2019-08-09 DIAGNOSIS — M25521 Pain in right elbow: Secondary | ICD-10-CM | POA: Diagnosis not present

## 2019-08-15 DIAGNOSIS — S52124D Nondisplaced fracture of head of right radius, subsequent encounter for closed fracture with routine healing: Secondary | ICD-10-CM | POA: Diagnosis not present

## 2019-09-12 DIAGNOSIS — S52124D Nondisplaced fracture of head of right radius, subsequent encounter for closed fracture with routine healing: Secondary | ICD-10-CM | POA: Diagnosis not present

## 2020-01-15 ENCOUNTER — Encounter: Payer: Self-pay | Admitting: Podiatry

## 2020-01-15 ENCOUNTER — Ambulatory Visit (INDEPENDENT_AMBULATORY_CARE_PROVIDER_SITE_OTHER): Payer: BC Managed Care – PPO

## 2020-01-15 ENCOUNTER — Other Ambulatory Visit: Payer: Self-pay

## 2020-01-15 ENCOUNTER — Ambulatory Visit (INDEPENDENT_AMBULATORY_CARE_PROVIDER_SITE_OTHER): Payer: BC Managed Care – PPO | Admitting: Podiatry

## 2020-01-15 ENCOUNTER — Other Ambulatory Visit: Payer: Self-pay | Admitting: Podiatry

## 2020-01-15 VITALS — Temp 97.1°F

## 2020-01-15 DIAGNOSIS — M76822 Posterior tibial tendinitis, left leg: Secondary | ICD-10-CM

## 2020-01-15 DIAGNOSIS — M79672 Pain in left foot: Secondary | ICD-10-CM

## 2020-01-15 NOTE — Progress Notes (Signed)
Subjective:   Patient ID: Denise Gibson, female   DOB: 59 y.o.   MRN: [id number]   HPI Patient presents stating she is developed a lot of pain on the inside of her left ankle and she has not been seen in almost 2 years and that was for heel pain.  States this is new and has been present around 6 weeks    ROS      Objective:  Physical Exam  Neurovascular status intact with patient's left foot medial side found to be quite inflamed at the insertion of the tendon into the navicular.  It is very sore when pressed making walking difficult     Assessment:  Posterior tibial tendinitis left with inflammation fluid at the insertion with no indication of muscle strength loss     Plan:  H&P condition and x-ray reviewed.  I went ahead today did sterile prep and injected the insertion 3 mg Dexasone Kenalog 5 mg Xylocaine applied fascial brace to lift up the arch and gave instructions for anti-inflammatories physical therapy ice therapy.  Reappoint to recheck  X-rays indicate there is some enlargement around the bone structure no signs of other pathology or indications of fracture

## 2020-03-05 ENCOUNTER — Other Ambulatory Visit: Payer: Self-pay | Admitting: Obstetrics and Gynecology

## 2020-03-05 DIAGNOSIS — Z1231 Encounter for screening mammogram for malignant neoplasm of breast: Secondary | ICD-10-CM

## 2020-03-05 NOTE — Progress Notes (Signed)
59 y.o. G79P1001 Married Caucasian female here for annual exam.    Still having hot flashes, and she would like to Gabapentin.  She is having night time hot flashes that are more intense.  Had Covid and had mild symptoms.  Vaccinated against Covid.   Has not done her flu vaccine.   PCP: Carol Ada, MD  No LMP recorded. Patient has had a hysterectomy.           Sexually active: Yes.    The current method of family planning is status post hysterectomy.    Exercising: Yes.    walking  Smoker:  no  Health Maintenance: Pap: 2005 normal History of abnormal Pap:  no MMG: 02-16-19 3D/Neg/density C/BiRads1--appt.03-18-20 Colonoscopy: 07/2014 benign polyps;next due 07/2024  BMD:   n/a  Result  n/a TDaP: 01-20-19 Gardasil:   no HIV:12-12-15 NR Hep C:12-12-15 Neg Screening Labs:  PCP.    reports that she has never smoked. She has never used smokeless tobacco. She reports that she does not drink alcohol and does not use drugs.  Past Medical History:  Diagnosis Date  . Breast cyst october 2012   rigth breast  . Diarrhea   . History of COVID-19 05/2019  . Hyperlipidemia 2017  . IBS (irritable bowel syndrome)   . Plantar fasciitis   . Rosacea   . Tendinitis    Rt arm 2011, Lt arm 01/2012  . Vitamin D deficiency 2016  . Weight loss, unintentional     Past Surgical History:  Procedure Laterality Date  . BREAST CYST ASPIRATION Left 2011   5cc  . BREAST EXCISIONAL BIOPSY Right   . BREAST SURGERY Right 03/09/11   exc of right breast duct system   . CHOLECYSTECTOMY  03/25/11   Laparoscopy  . LAPAROSCOPIC CHOLECYSTECTOMY  2002  . VAGINAL HYSTERECTOMY  1998   DUB    Current Outpatient Medications  Medication Sig Dispense Refill  . Acetaminophen (TYLENOL PO) Take by mouth as needed.    . Calcium Carbonate-Vitamin D (CALCIUM 600+D PO) Take by mouth daily. Has 800mg  of vitamin D3    . Cholecalciferol (VITAMIN D3) 2000 units TABS Take 1 tablet by mouth daily.    . famotidine  (PEPCID) 20 MG tablet Take 20 mg by mouth 2 (two) times daily.    Marland Kitchen Fexofenadine HCl (ALLEGRA PO) Take by mouth as needed.      . metroNIDAZOLE (METROGEL) 1 % gel Apply 1 application topically daily.    . TURMERIC PO Take 1 capsule by mouth daily.     No current facility-administered medications for this visit.    Family History  Problem Relation Age of Onset  . Hypertension Mother   . COPD Mother   . Fibromyalgia Mother   . Hyperlipidemia Father   . Breast cancer Maternal Grandmother     Review of Systems  All other systems reviewed and are negative.   Exam:   BP 136/84   Pulse 70   Resp 14   Ht 5\' 3"  (1.6 m)   Wt 153 lb (69.4 kg)   BMI 27.10 kg/m     General appearance: alert, cooperative and appears stated age Head: normocephalic, without obvious abnormality, atraumatic Neck: no adenopathy, supple, symmetrical, trachea midline and thyroid normal to inspection and palpation Lungs: clear to auscultation bilaterally Breasts: normal appearance, no masses or tenderness, No nipple retraction or dimpling, No nipple discharge or bleeding, No axillary adenopathy Heart: regular rate and rhythm Abdomen: soft, non-tender; no masses, no organomegaly  Extremities: extremities normal, atraumatic, no cyanosis or edema Skin: skin color, texture, turgor normal. No rashes or lesions Lymph nodes: cervical, supraclavicular, and axillary nodes normal. Neurologic: grossly normal  Pelvic: External genitalia:  no lesions              No abnormal inguinal nodes palpated.              Urethra:  normal appearing urethra with no masses, tenderness or lesions              Bartholins and Skenes: normal                 Vagina: normal appearing vagina with normal color and discharge, no lesions              Cervix: absent              Pap taken: No. Bimanual Exam:  Uterus:  absent              Adnexa: no mass, fullness, tenderness              Rectal exam: Yes.  .  Confirms.              Anus:   normal sphincter tone, no lesions  Chaperone was present for exam.  Assessment:   Well woman visit with normal exam. Status post total vaginal hysterectomy.Ovaries remain. Menopausal symptoms. Side effects from Effexor,so stopped. Hyperlipidemia.  Plan: Mammogram screening discussed. Self breast awareness reviewed. Pap and HR HPV as above. Guidelines for Calcium, Vitamin D, regular exercise program including cardiovascular and weight bearing exercise. Gabapentin 100 mg nightly and increase weekly to 300 mg nightly.   Potential side effects discussed. Fu in 6 weeks. Follow up annually and prn.   After visit summary provided.

## 2020-03-06 ENCOUNTER — Ambulatory Visit: Payer: BC Managed Care – PPO | Admitting: Obstetrics and Gynecology

## 2020-03-06 ENCOUNTER — Encounter: Payer: Self-pay | Admitting: Obstetrics and Gynecology

## 2020-03-06 ENCOUNTER — Other Ambulatory Visit: Payer: Self-pay

## 2020-03-06 ENCOUNTER — Telehealth: Payer: Self-pay

## 2020-03-06 VITALS — BP 136/84 | HR 70 | Resp 14 | Ht 63.0 in | Wt 153.0 lb

## 2020-03-06 DIAGNOSIS — Z01419 Encounter for gynecological examination (general) (routine) without abnormal findings: Secondary | ICD-10-CM | POA: Diagnosis not present

## 2020-03-06 MED ORDER — GABAPENTIN 100 MG PO CAPS: 100.0000 mg | ORAL_CAPSULE | Freq: Every day | ORAL | 1 refills | Status: DC

## 2020-03-06 NOTE — Telephone Encounter (Signed)
Call placed to patient, left detailed message, name identified on voicemail, ok per dpr.  OV scheduled for 11/23 at 4pm with Dr. Quincy Simmonds, arriving at 3:45pm for check in.  Return call to office if you need to reschedule or if any additional questions.  Encounter closed.

## 2020-03-06 NOTE — Telephone Encounter (Signed)
Patient need a six week recheck with Dr. Quincy Simmonds. Sending to triage to assist with scheduling.

## 2020-03-06 NOTE — Patient Instructions (Signed)
EXERCISE AND DIET:  We recommended that you start or continue a regular exercise program for good health. Regular exercise means any activity that makes your heart beat faster and makes you sweat.  We recommend exercising at least 30 minutes per day at least 3 days a week, preferably 4 or 5.  We also recommend a diet low in fat and sugar.  Inactivity, poor dietary choices and obesity can cause diabetes, heart attack, stroke, and kidney damage, among others.    ALCOHOL AND SMOKING:  Women should limit their alcohol intake to no more than 7 drinks/beers/glasses of wine (combined, not each!) per week. Moderation of alcohol intake to this level decreases your risk of breast cancer and liver damage. And of course, no recreational drugs are part of a healthy lifestyle.  And absolutely no smoking or even second hand smoke. Most people know smoking can cause heart and lung diseases, but did you know it also contributes to weakening of your bones? Aging of your skin?  Yellowing of your teeth and nails?  CALCIUM AND VITAMIN D:  Adequate intake of calcium and Vitamin D are recommended.  The recommendations for exact amounts of these supplements seem to change often, but generally speaking 600 mg of calcium (either carbonate or citrate) and 800 units of Vitamin D per day seems prudent. Certain women may benefit from higher intake of Vitamin D.  If you are among these women, your doctor will have told you during your visit.    PAP SMEARS:  Pap smears, to check for cervical cancer or precancers,  have traditionally been done yearly, although recent scientific advances have shown that most women can have pap smears less often.  However, every woman still should have a physical exam from her gynecologist every year. It will include a breast check, inspection of the vulva and vagina to check for abnormal growths or skin changes, a visual exam of the cervix, and then an exam to evaluate the size and shape of the uterus and  ovaries.  And after 59 years of age, a rectal exam is indicated to check for rectal cancers. We will also provide age appropriate advice regarding health maintenance, like when you should have certain vaccines, screening for sexually transmitted diseases, bone density testing, colonoscopy, mammograms, etc.   MAMMOGRAMS:  All women over 40 years old should have a yearly mammogram. Many facilities now offer a "3D" mammogram, which may cost around $50 extra out of pocket. If possible,  we recommend you accept the option to have the 3D mammogram performed.  It both reduces the number of women who will be called back for extra views which then turn out to be normal, and it is better than the routine mammogram at detecting truly abnormal areas.    COLONOSCOPY:  Colonoscopy to screen for colon cancer is recommended for all women at age 50.  We know, you hate the idea of the prep.  We agree, BUT, having colon cancer and not knowing it is worse!!  Colon cancer so often starts as a polyp that can be seen and removed at colonscopy, which can quite literally save your life!  And if your first colonoscopy is normal and you have no family history of colon cancer, most women don't have to have it again for 10 years.  Once every ten years, you can do something that may end up saving your life, right?  We will be happy to help you get it scheduled when you are ready.    Be sure to check your insurance coverage so you understand how much it will cost.  It may be covered as a preventative service at no cost, but you should check your particular policy.       Gabapentin capsules or tablets What is this medicine? GABAPENTIN (GA ba pen tin) is used to control seizures in certain types of epilepsy. It is also used to treat certain types of nerve pain. This medicine may be used for other purposes; ask your health care provider or pharmacist if you have questions. COMMON BRAND NAME(S): Active-PAC with Gabapentin, Gabarone,  Neurontin What should I tell my health care provider before I take this medicine? They need to know if you have any of these conditions:  history of drug abuse or alcohol abuse problem  kidney disease  lung or breathing disease  suicidal thoughts, plans, or attempt; a previous suicide attempt by you or a family member  an unusual or allergic reaction to gabapentin, other medicines, foods, dyes, or preservatives  pregnant or trying to get pregnant  breast-feeding How should I use this medicine? Take this medicine by mouth with a glass of water. Follow the directions on the prescription label. You can take it with or without food. If it upsets your stomach, take it with food. Take your medicine at regular intervals. Do not take it more often than directed. Do not stop taking except on your doctor's advice. If you are directed to break the 600 or 800 mg tablets in half as part of your dose, the extra half tablet should be used for the next dose. If you have not used the extra half tablet within 28 days, it should be thrown away. A special MedGuide will be given to you by the pharmacist with each prescription and refill. Be sure to read this information carefully each time. Talk to your pediatrician regarding the use of this medicine in children. While this drug may be prescribed for children as young as 3 years for selected conditions, precautions do apply. Overdosage: If you think you have taken too much of this medicine contact a poison control center or emergency room at once. NOTE: This medicine is only for you. Do not share this medicine with others. What if I miss a dose? If you miss a dose, take it as soon as you can. If it is almost time for your next dose, take only that dose. Do not take double or extra doses. What may interact with this medicine? This medicine may interact with the following medications:  alcohol  antihistamines for allergy, cough, and cold  certain medicines  for anxiety or sleep  certain medicines for depression like amitriptyline, fluoxetine, sertraline  certain medicines for seizures like phenobarbital, primidone  certain medicines for stomach problems  general anesthetics like halothane, isoflurane, methoxyflurane, propofol  local anesthetics like lidocaine, pramoxine, tetracaine  medicines that relax muscles for surgery  narcotic medicines for pain  phenothiazines like chlorpromazine, mesoridazine, prochlorperazine, thioridazine This list may not describe all possible interactions. Give your health care provider a list of all the medicines, herbs, non-prescription drugs, or dietary supplements you use. Also tell them if you smoke, drink alcohol, or use illegal drugs. Some items may interact with your medicine. What should I watch for while using this medicine? Visit your doctor or health care provider for regular checks on your progress. You may want to keep a record at home of how you feel your condition is responding to treatment. You may want  to share this information with your doctor or health care provider at each visit. You should contact your doctor or health care provider if your seizures get worse or if you have any new types of seizures. Do not stop taking this medicine or any of your seizure medicines unless instructed by your doctor or health care provider. Stopping your medicine suddenly can increase your seizures or their severity. This medicine may cause serious skin reactions. They can happen weeks to months after starting the medicine. Contact your health care provider right away if you notice fevers or flu-like symptoms with a rash. The rash may be red or purple and then turn into blisters or peeling of the skin. Or, you might notice a red rash with swelling of the face, lips or lymph nodes in your neck or under your arms. Wear a medical identification bracelet or chain if you are taking this medicine for seizures, and carry a  card that lists all your medications. You may get drowsy, dizzy, or have blurred vision. Do not drive, use machinery, or do anything that needs mental alertness until you know how this medicine affects you. To reduce dizzy or fainting spells, do not sit or stand up quickly, especially if you are an older patient. Alcohol can increase drowsiness and dizziness. Avoid alcoholic drinks. Your mouth may get dry. Chewing sugarless gum or sucking hard candy, and drinking plenty of water will help. The use of this medicine may increase the chance of suicidal thoughts or actions. Pay special attention to how you are responding while on this medicine. Any worsening of mood, or thoughts of suicide or dying should be reported to your health care provider right away. Women who become pregnant while using this medicine may enroll in the [place] Pregnancy Registry by calling [phone]. This registry collects information about the safety of antiepileptic drug use during pregnancy. What side effects may I notice from receiving this medicine? Side effects that you should report to your doctor or health care professional as soon as possible:  allergic reactions like skin rash, itching or hives, swelling of the face, lips, or tongue  breathing problems  rash, fever, and swollen lymph nodes  redness, blistering, peeling or loosening of the skin, including inside the mouth  suicidal thoughts, mood changes Side effects that usually do not require medical attention (report to your doctor or health care professional if they continue or are bothersome):  dizziness  drowsiness  headache  nausea, vomiting  swelling of ankles, feet, hands  tiredness This list may not describe all possible side effects. Call your doctor for medical advice about side effects. You may report side effects to FDA at 1-800-FDA-1088. Where should I keep my medicine? Keep out of reach of children. This  medicine may cause accidental overdose and death if it taken by other adults, children, or pets. Mix any unused medicine with a substance like cat litter or coffee grounds. Then throw the medicine away in a sealed container like a sealed bag or a coffee can with a lid. Do not use the medicine after the expiration date. Store at room temperature between 15 and 30 degrees C (59 and 86 degrees F). NOTE: This sheet is a summary. It may not cover all possible information. If you have questions about this medicine, talk to your doctor, pharmacist, or health care provider.  2020 Elsevier/Gold Standard (2018-08-12 14:16:43)

## 2020-03-12 ENCOUNTER — Telehealth: Payer: Self-pay

## 2020-03-12 NOTE — Telephone Encounter (Signed)
Patient is calling in regards to a follow up that was made for her. Patient states she cannot make an afternoon appointment that it has to be early morning. It is ok to leave a detailed message on cell.

## 2020-03-12 NOTE — Telephone Encounter (Signed)
Call returned to patient, left detailed message, ok per dpr. OV r/s to 11/19 at 9:30am, arrive at 9:15am, with Dr. Quincy Simmonds. Return call to office if you need to make any changes to this appt.   Encounter closed.

## 2020-03-18 ENCOUNTER — Other Ambulatory Visit: Payer: Self-pay

## 2020-03-18 ENCOUNTER — Ambulatory Visit
Admission: RE | Admit: 2020-03-18 | Discharge: 2020-03-18 | Disposition: A | Discharge status: Home / Self Care | Payer: BC Managed Care – PPO | Source: Ambulatory Visit | Attending: Obstetrics and Gynecology | Admitting: Obstetrics and Gynecology

## 2020-03-18 DIAGNOSIS — Z1231 Encounter for screening mammogram for malignant neoplasm of breast: Secondary | ICD-10-CM

## 2020-04-12 ENCOUNTER — Other Ambulatory Visit: Payer: Self-pay

## 2020-04-12 ENCOUNTER — Encounter: Payer: Self-pay | Admitting: Obstetrics and Gynecology

## 2020-04-12 ENCOUNTER — Ambulatory Visit: Payer: BC Managed Care – PPO | Admitting: Obstetrics and Gynecology

## 2020-04-12 VITALS — BP 170/98 | HR 88 | Resp 14 | Ht 63.0 in | Wt 153.1 lb

## 2020-04-12 DIAGNOSIS — Z5181 Encounter for therapeutic drug level monitoring: Secondary | ICD-10-CM | POA: Diagnosis not present

## 2020-04-12 DIAGNOSIS — N951 Menopausal and female climacteric states: Secondary | ICD-10-CM | POA: Diagnosis not present

## 2020-04-12 NOTE — Progress Notes (Signed)
GYNECOLOGY  VISIT   HPI: 59 y.o.   Married  Caucasian  female   G1P1001 with No LMP recorded. Patient has had a hysterectomy.   here for   6 week follow up of Gabapentin. Patient states medication has made her hot flashes worse, and has made her nauseous, anxious and unable to sleep.   States she felt ok at 100 mg.  She did not get any relief of her menopausal symptoms at this dose.   The Effexor did not make her feel well in general.   GYNECOLOGIC HISTORY: No LMP recorded. Patient has had a hysterectomy. Contraception:  Hysterectomy  Menopausal hormone therapy:  none Last mammogram:  03-18-20 density C/BIRADS 1 negative  Last pap smear:   2005 normal         OB History    Gravida  1   Para  1   Term  1   Preterm      AB      Living  1     SAB      TAB      Ectopic      Multiple      Live Births  1              There are no problems to display for this patient.   Past Medical History:  Diagnosis Date  . Breast cyst october 2012   rigth breast  . Diarrhea   . History of COVID-19 05/2019  . Hyperlipidemia 2017  . IBS (irritable bowel syndrome)   . Plantar fasciitis   . Rosacea   . Tendinitis    Rt arm 2011, Lt arm 01/2012  . Vitamin D deficiency 2016  . Weight loss, unintentional     Past Surgical History:  Procedure Laterality Date  . BREAST CYST ASPIRATION Left 2011   5cc  . BREAST EXCISIONAL BIOPSY Right   . BREAST SURGERY Right 03/09/11   exc of right breast duct system   . CHOLECYSTECTOMY  03/25/11   Laparoscopy  . LAPAROSCOPIC CHOLECYSTECTOMY  2002  . VAGINAL HYSTERECTOMY  1998   DUB    Current Outpatient Medications  Medication Sig Dispense Refill  . Acetaminophen (TYLENOL PO) Take by mouth as needed.    . Calcium Carbonate-Vitamin D (CALCIUM 600+D PO) Take by mouth daily. Has 800mg  of vitamin D3    . Cholecalciferol (VITAMIN D3) 2000 units TABS Take 1 tablet by mouth daily.    . famotidine (PEPCID) 20 MG tablet Take 20 mg by  mouth 2 (two) times daily.    Marland Kitchen Fexofenadine HCl (ALLEGRA PO) Take by mouth as needed.      . gabapentin (NEURONTIN) 100 MG capsule Take 1 capsule (100 mg total) by mouth at bedtime. Increase to 2 capsules (200 mg) nightly one week later.  Then increase to 3 capsules (300 mg) nightly in your third week of use and continue this dose. 90 capsule 1  . metroNIDAZOLE (METROGEL) 1 % gel Apply 1 application topically daily.    . TURMERIC PO Take 1 capsule by mouth daily.     No current facility-administered medications for this visit.     ALLERGIES: Patient has no known allergies.  Family History  Problem Relation Age of Onset  . Hypertension Mother   . COPD Mother   . Fibromyalgia Mother   . Hyperlipidemia Father   . Breast cancer Maternal Grandmother     Social History   Socioeconomic History  . Marital status: Married  Spouse name: Not on file  . Number of children: Not on file  . Years of education: Not on file  . Highest education level: Not on file  Occupational History  . Not on file  Tobacco Use  . Smoking status: Never Smoker  . Smokeless tobacco: Never Used  Vaping Use  . Vaping Use: Never used  Substance and Sexual Activity  . Alcohol use: No    Alcohol/week: 0.0 standard drinks  . Drug use: No  . Sexual activity: Yes    Partners: Male    Birth control/protection: Surgical    Comment: [facility]--ovaries [phone])  Other Topics Concern  . Not on file  Social History Narrative  . Not on file   Social Determinants of Health   Financial Resource Strain:   . Difficulty of Paying Living Expenses: Not on file  Food Insecurity:   . Worried About Charity fundraiser in the Last Year: Not on file  . Ran Out of Food in the Last Year: Not on file  Transportation Needs:   . Lack of Transportation (Medical): Not on file  . Lack of Transportation (Non-Medical): Not on file  Physical Activity:   . Days of Exercise per Week: Not on file  . Minutes of Exercise per  Session: Not on file  Stress:   . Feeling of Stress : Not on file  Social Connections:   . Frequency of Communication with Friends and Family: Not on file  . Frequency of Social Gatherings with Friends and Family: Not on file  . Attends Religious Services: Not on file  . Active Member of Clubs or Organizations: Not on file  . Attends Archivist Meetings: Not on file  . Marital Status: Not on file  Intimate Partner Violence:   . Fear of Current or Ex-Partner: Not on file  . Emotionally Abused: Not on file  . Physically Abused: Not on file  . Sexually Abused: Not on file    Review of Systems  Gastrointestinal: Positive for nausea.  Endocrine: Positive for heat intolerance.  Psychiatric/Behavioral: Positive for sleep disturbance. The patient is nervous/anxious.   All other systems reviewed and are negative.   PHYSICAL EXAMINATION:    BP (!) 170/98 (BP Location: Left Arm, Patient Position: Sitting, Cuff Size: Normal)   Pulse 88   Resp 14   Ht 5\' 3"  (1.6 m)   Wt 153 lb 1.6 oz (69.4 kg)   BMI 27.12 kg/m     General appearance: alert, cooperative and appears stated age   ASSESSMENT  Menopausal symptoms.  Intolerant of Gabapentin.   PLAN  Reduce Gabapentin to 100 mg for 3 nights and then stop.  She declines Paxil. Start Estroven with melatonin.  We talked about cotton bedding and night wear, fans, etc.  FU prn.   15 min  total time was spent for this patient encounter, including preparation, face-to-face counseling with the patient, coordination of care, and documentation of the encounter.

## 2020-04-16 ENCOUNTER — Ambulatory Visit: Payer: BC Managed Care – PPO | Admitting: Obstetrics and Gynecology

## 2020-04-30 DIAGNOSIS — F439 Reaction to severe stress, unspecified: Secondary | ICD-10-CM | POA: Diagnosis not present

## 2020-04-30 DIAGNOSIS — E7800 Pure hypercholesterolemia, unspecified: Secondary | ICD-10-CM | POA: Diagnosis not present

## 2020-04-30 DIAGNOSIS — R030 Elevated blood-pressure reading, without diagnosis of hypertension: Secondary | ICD-10-CM | POA: Diagnosis not present

## 2020-06-06 DIAGNOSIS — I10 Essential (primary) hypertension: Secondary | ICD-10-CM | POA: Diagnosis not present

## 2020-07-04 DIAGNOSIS — I10 Essential (primary) hypertension: Secondary | ICD-10-CM | POA: Diagnosis not present

## 2020-07-09 DIAGNOSIS — M5459 Other low back pain: Secondary | ICD-10-CM | POA: Diagnosis not present

## 2020-07-10 DIAGNOSIS — M545 Low back pain, unspecified: Secondary | ICD-10-CM | POA: Diagnosis not present

## 2020-07-16 DIAGNOSIS — M545 Low back pain, unspecified: Secondary | ICD-10-CM | POA: Diagnosis not present

## 2020-07-16 DIAGNOSIS — M546 Pain in thoracic spine: Secondary | ICD-10-CM | POA: Diagnosis not present

## 2020-07-19 DIAGNOSIS — M546 Pain in thoracic spine: Secondary | ICD-10-CM | POA: Diagnosis not present

## 2020-07-19 DIAGNOSIS — M545 Low back pain, unspecified: Secondary | ICD-10-CM | POA: Diagnosis not present

## 2020-07-26 DIAGNOSIS — M545 Low back pain, unspecified: Secondary | ICD-10-CM | POA: Diagnosis not present

## 2020-07-26 DIAGNOSIS — M546 Pain in thoracic spine: Secondary | ICD-10-CM | POA: Diagnosis not present

## 2020-07-29 DIAGNOSIS — M546 Pain in thoracic spine: Secondary | ICD-10-CM | POA: Diagnosis not present

## 2020-07-29 DIAGNOSIS — M545 Low back pain, unspecified: Secondary | ICD-10-CM | POA: Diagnosis not present

## 2020-08-01 DIAGNOSIS — M545 Low back pain, unspecified: Secondary | ICD-10-CM | POA: Diagnosis not present

## 2020-08-01 DIAGNOSIS — M546 Pain in thoracic spine: Secondary | ICD-10-CM | POA: Diagnosis not present

## 2020-08-06 DIAGNOSIS — M545 Low back pain, unspecified: Secondary | ICD-10-CM | POA: Diagnosis not present

## 2020-08-06 DIAGNOSIS — M546 Pain in thoracic spine: Secondary | ICD-10-CM | POA: Diagnosis not present

## 2020-08-12 DIAGNOSIS — M545 Low back pain, unspecified: Secondary | ICD-10-CM | POA: Diagnosis not present

## 2020-08-12 DIAGNOSIS — M546 Pain in thoracic spine: Secondary | ICD-10-CM | POA: Diagnosis not present

## 2020-08-16 DIAGNOSIS — M545 Low back pain, unspecified: Secondary | ICD-10-CM | POA: Diagnosis not present

## 2020-08-16 DIAGNOSIS — M546 Pain in thoracic spine: Secondary | ICD-10-CM | POA: Diagnosis not present

## 2020-08-19 DIAGNOSIS — M546 Pain in thoracic spine: Secondary | ICD-10-CM | POA: Diagnosis not present

## 2020-08-21 DIAGNOSIS — M545 Low back pain, unspecified: Secondary | ICD-10-CM | POA: Diagnosis not present

## 2020-08-21 DIAGNOSIS — M546 Pain in thoracic spine: Secondary | ICD-10-CM | POA: Diagnosis not present

## 2020-10-11 ENCOUNTER — Other Ambulatory Visit: Payer: Self-pay | Admitting: Podiatry

## 2020-10-11 ENCOUNTER — Encounter: Payer: Self-pay | Admitting: Podiatry

## 2020-10-11 ENCOUNTER — Other Ambulatory Visit: Payer: Self-pay

## 2020-10-11 ENCOUNTER — Ambulatory Visit (INDEPENDENT_AMBULATORY_CARE_PROVIDER_SITE_OTHER): Payer: BC Managed Care – PPO

## 2020-10-11 ENCOUNTER — Ambulatory Visit: Payer: BC Managed Care – PPO | Admitting: Podiatry

## 2020-10-11 DIAGNOSIS — M76822 Posterior tibial tendinitis, left leg: Secondary | ICD-10-CM

## 2020-10-11 DIAGNOSIS — M79672 Pain in left foot: Secondary | ICD-10-CM

## 2020-10-11 MED ORDER — TRIAMCINOLONE ACETONIDE 10 MG/ML IJ SUSP
10.0000 mg | Freq: Once | INTRAMUSCULAR | Status: AC
  Administered 2020-10-11: 10 mg

## 2020-10-11 NOTE — Progress Notes (Signed)
Subjective:   Patient ID: Denise Gibson, female   DOB: 60 y.o.   MRN: [id number]   HPI Patient states she developed a lot of pain in her left foot and the inside of the ankle.  States it just is started in the last few weeks and she is not been seen in about 9 months   ROS      Objective:  Physical Exam  Neurovascular status intact inflammation pain posterior tibial insertion left navicular with mild fluid buildup around the area short-term duration     Assessment:  Acute posterior tibial tendinitis left with inflammation     Plan:  H&P reviewed condition sterile prep injected the tendon at insertion 3 mg Dexasone Kenalog 5 g liken advised on brace usage ice therapy and support and reappoint to recheck  X-rays were negative for signs of been a change in the condition of the navicular or a collapse of the medial longitudinal arch

## 2021-01-01 DIAGNOSIS — K219 Gastro-esophageal reflux disease without esophagitis: Secondary | ICD-10-CM | POA: Diagnosis not present

## 2021-01-01 DIAGNOSIS — E7800 Pure hypercholesterolemia, unspecified: Secondary | ICD-10-CM | POA: Diagnosis not present

## 2021-01-01 DIAGNOSIS — I10 Essential (primary) hypertension: Secondary | ICD-10-CM | POA: Diagnosis not present

## 2021-02-14 ENCOUNTER — Other Ambulatory Visit: Payer: Self-pay | Admitting: Obstetrics and Gynecology

## 2021-02-14 DIAGNOSIS — Z1231 Encounter for screening mammogram for malignant neoplasm of breast: Secondary | ICD-10-CM

## 2021-03-19 ENCOUNTER — Ambulatory Visit
Admission: RE | Admit: 2021-03-19 | Discharge: 2021-03-19 | Disposition: A | Discharge status: Home / Self Care | Payer: BC Managed Care – PPO | Source: Ambulatory Visit | Attending: Obstetrics and Gynecology | Admitting: Obstetrics and Gynecology

## 2021-03-19 ENCOUNTER — Other Ambulatory Visit: Payer: Self-pay

## 2021-03-19 DIAGNOSIS — Z1231 Encounter for screening mammogram for malignant neoplasm of breast: Secondary | ICD-10-CM | POA: Diagnosis not present

## 2021-04-02 ENCOUNTER — Ambulatory Visit (INDEPENDENT_AMBULATORY_CARE_PROVIDER_SITE_OTHER): Payer: BC Managed Care – PPO | Admitting: Obstetrics and Gynecology

## 2021-04-02 ENCOUNTER — Other Ambulatory Visit: Payer: Self-pay

## 2021-04-02 ENCOUNTER — Encounter: Payer: Self-pay | Admitting: Obstetrics and Gynecology

## 2021-04-02 VITALS — BP 118/70 | HR 70 | Ht 63.0 in | Wt 139.0 lb

## 2021-04-02 DIAGNOSIS — Z01419 Encounter for gynecological examination (general) (routine) without abnormal findings: Secondary | ICD-10-CM | POA: Diagnosis not present

## 2021-04-02 NOTE — Patient Instructions (Signed)

## 2021-04-02 NOTE — Progress Notes (Signed)
60 y.o. G84P1001 Married Caucasian female here for annual exam.    Still with hot flashes.  Taking black cohosh and not seeing a lot of difference.  Declines Paxil.  Some IBS.  No regular diarrhea.   Has lost weight.  Eating more frequent small meals.   PCP: Carol Ada, MD    No LMP recorded. Patient has had a hysterectomy.           Sexually active: Yes.    The current method of family planning is status post hysterectomy.    Exercising: Yes.     walking Smoker:  no  Health Maintenance: Pap:  2005 normal History of abnormal Pap:  no MMG:  03-19-21  3D/Neg/Birads1 Colonoscopy: 07/2014 benign polyps;next due 07/2024   BMD:   n/a  Result  n/a TDaP:  01-20-19 Gardasil:   no HIV:12-12-15 NR Hep C:12-12-15 Neg Screening Labs:  PCP Flu vaccine:  declined.  Covid:  Completed original series.  No booster.   reports that she has never smoked. She has never used smokeless tobacco. She reports that she does not drink alcohol and does not use drugs.  Past Medical History:  Diagnosis Date   Breast cyst 02/2011   rigth breast   Diarrhea    History of COVID-19 05/2019   Hyperlipidemia 2017   Hypertension    IBS (irritable bowel syndrome)    Plantar fasciitis    Rosacea    Tendinitis    Rt arm 2011, Lt arm 01/2012   Vitamin D deficiency 2016   Weight loss, unintentional     Past Surgical History:  Procedure Laterality Date   BREAST CYST ASPIRATION Left 2011   5cc   BREAST EXCISIONAL BIOPSY Right    BREAST SURGERY Right 03/09/11   exc of right breast duct system    CHOLECYSTECTOMY  03/25/11   Laparoscopy   LAPAROSCOPIC CHOLECYSTECTOMY  2002   VAGINAL HYSTERECTOMY  1998   DUB    Current Outpatient Medications  Medication Sig Dispense Refill   Acetaminophen (TYLENOL PO) Take by mouth as needed.     Black Cohosh 20 MG TABS Take 1 tablet by mouth 2 (two) times daily.     Calcium Carbonate-Vitamin D (CALCIUM 600+D PO) Take by mouth daily. Has 800mg  of vitamin D3      Cholecalciferol (VITAMIN D3) 2000 units TABS Take 1 tablet by mouth daily.     famotidine (PEPCID) 20 MG tablet Take 20 mg by mouth 2 (two) times daily.     Fexofenadine HCl (ALLEGRA PO) Take by mouth as needed.       lisinopril (ZESTRIL) 10 MG tablet 1 tablet     Polyethyl Glycol-Propyl Glycol (SYSTANE) 0.4-0.3 % SOLN 1 drop into affected eye as needed     No current facility-administered medications for this visit.    Family History  Problem Relation Age of Onset   Hypertension Mother    COPD Mother    Fibromyalgia Mother    Hyperlipidemia Father    Breast cancer Maternal Grandmother     Review of Systems  All other systems reviewed and are negative.  Exam:   BP 118/70   Pulse 70   Ht 5\' 3"  (1.6 m)   Wt 139 lb (63 kg)   SpO2 100%   BMI 24.62 kg/m     General appearance: alert, cooperative and appears stated age Head: normocephalic, without obvious abnormality, atraumatic Neck: no adenopathy, supple, symmetrical, trachea midline and thyroid normal to inspection and palpation Lungs:  clear to auscultation bilaterally Breasts: normal appearance, no masses or tenderness, No nipple retraction or dimpling, No nipple discharge or bleeding, No axillary adenopathy Heart: regular rate and rhythm Abdomen: soft, non-tender; no masses, no organomegaly Extremities: extremities normal, atraumatic, no cyanosis or edema Skin: skin color, texture, turgor normal. No rashes or lesions Lymph nodes: cervical, supraclavicular, and axillary nodes normal. Neurologic: grossly normal  Pelvic: External genitalia:  no lesions              No abnormal inguinal nodes palpated.              Urethra:  normal appearing urethra with no masses, tenderness or lesions              Bartholins and Skenes: normal                 Vagina: normal appearing vagina with normal color and discharge, no lesions              Cervix: absent              Pap taken: no Bimanual Exam:  Uterus:  absent               Adnexa: no mass, fullness, tenderness              Rectal exam: yes.  Confirms.              Anus:  normal sphincter tone, no lesions  Chaperone was present for exam:  Estill Bamberg, CMA for pelvic and rectal exam.  Patient declined chaperone for breast exam.  Assessment:   Well woman visit with gynecologic exam. Status post total vaginal hysterectomy.  Ovaries remain.  Menopausal symptoms.  Side effects from Effexor and Gabapentin, so stopped. Hyperlipidemia.   Plan: Mammogram screening discussed. Self breast awareness reviewed. Pap and HR HPV as above. Guidelines for Calcium, Vitamin D, regular exercise program including cardiovascular and weight bearing exercise. Labs with PCP. We discussed bivalent Covid booster. Follow up annually and prn.   After visit summary provided.

## 2021-04-07 DIAGNOSIS — L718 Other rosacea: Secondary | ICD-10-CM | POA: Diagnosis not present

## 2021-04-07 DIAGNOSIS — L821 Other seborrheic keratosis: Secondary | ICD-10-CM | POA: Diagnosis not present

## 2021-05-12 DIAGNOSIS — R102 Pelvic and perineal pain: Secondary | ICD-10-CM | POA: Diagnosis not present

## 2021-05-12 DIAGNOSIS — R310 Gross hematuria: Secondary | ICD-10-CM | POA: Diagnosis not present

## 2021-05-12 DIAGNOSIS — R3915 Urgency of urination: Secondary | ICD-10-CM | POA: Diagnosis not present

## 2021-05-12 DIAGNOSIS — N3001 Acute cystitis with hematuria: Secondary | ICD-10-CM | POA: Diagnosis not present

## 2021-05-27 DIAGNOSIS — R310 Gross hematuria: Secondary | ICD-10-CM | POA: Diagnosis not present

## 2021-06-23 DIAGNOSIS — R399 Unspecified symptoms and signs involving the genitourinary system: Secondary | ICD-10-CM | POA: Diagnosis not present

## 2021-07-16 IMAGING — MG DIGITAL SCREENING BILAT W/ TOMO W/ CAD
8 series · 8 of 24 positions shown · non-contrast
Comparison: Previous exam(s).

CLINICAL DATA: Screening.

EXAM:
DIGITAL SCREENING BILATERAL MAMMOGRAM WITH TOMO AND CAD

[R CC synth-2D]
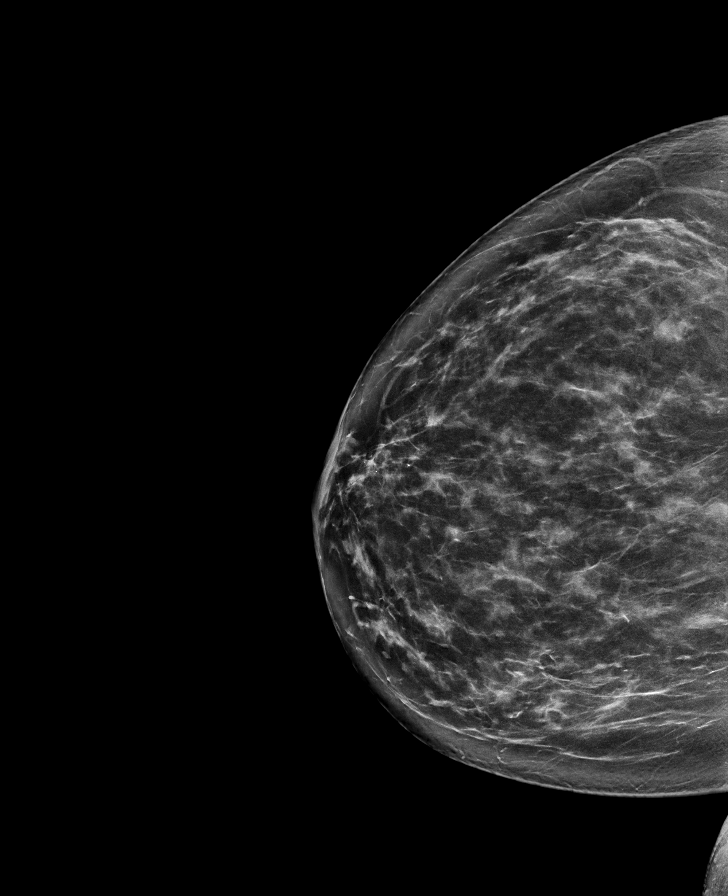

[R MLO synth-2D]
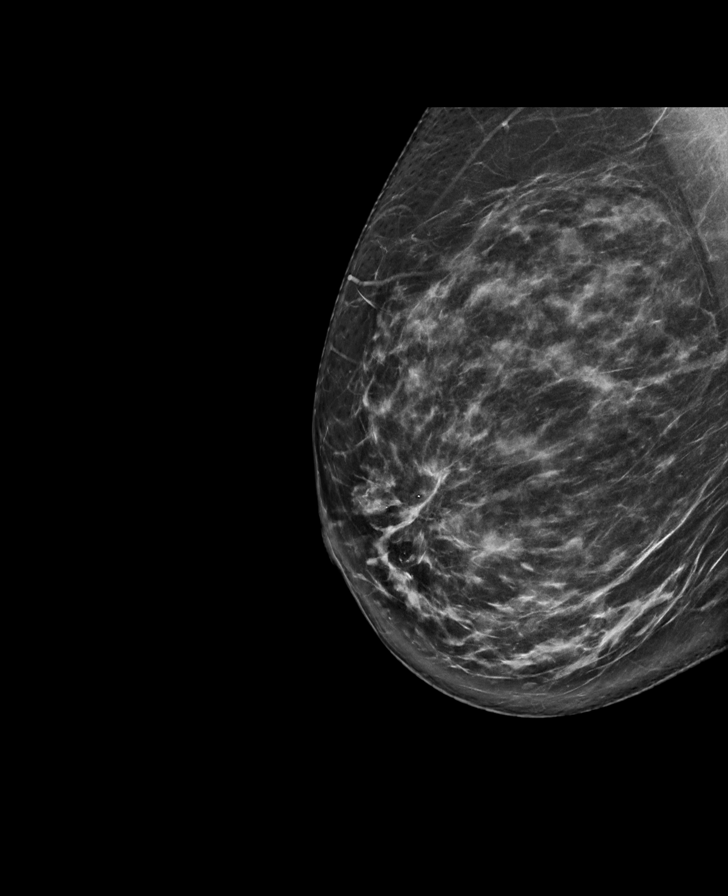

[L CC synth-2D]
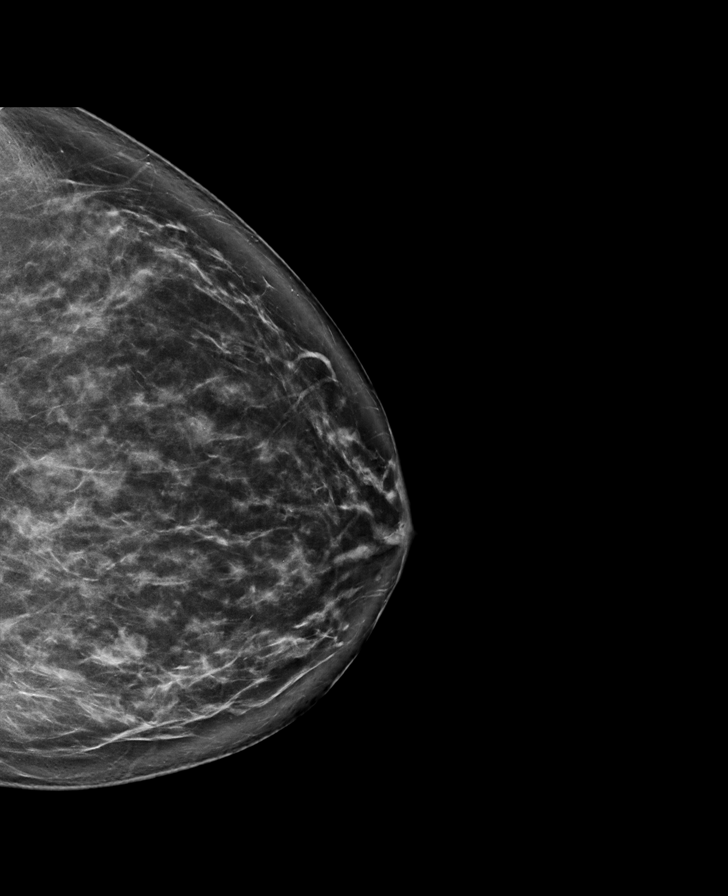

[L MLO synth-2D]
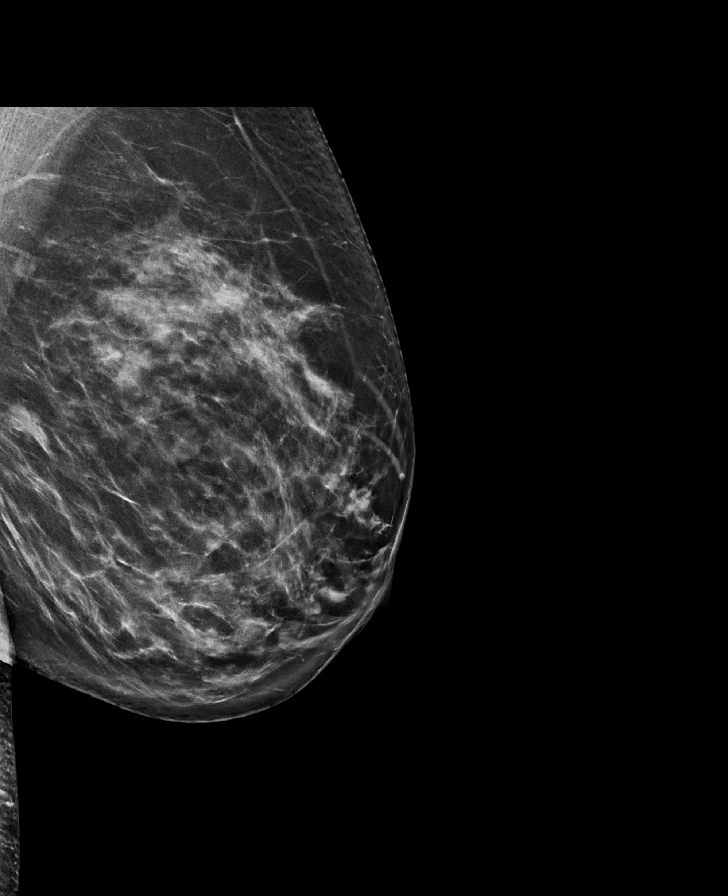

[R CC tomo · tomo slice 43/84.0]
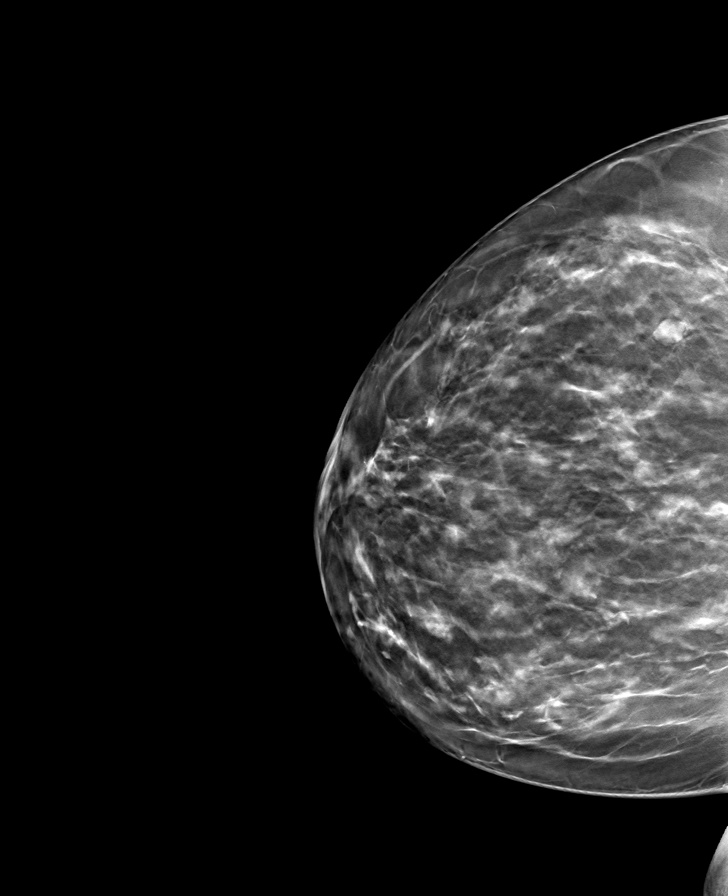

[L CC tomo · tomo slice 43/85.0]
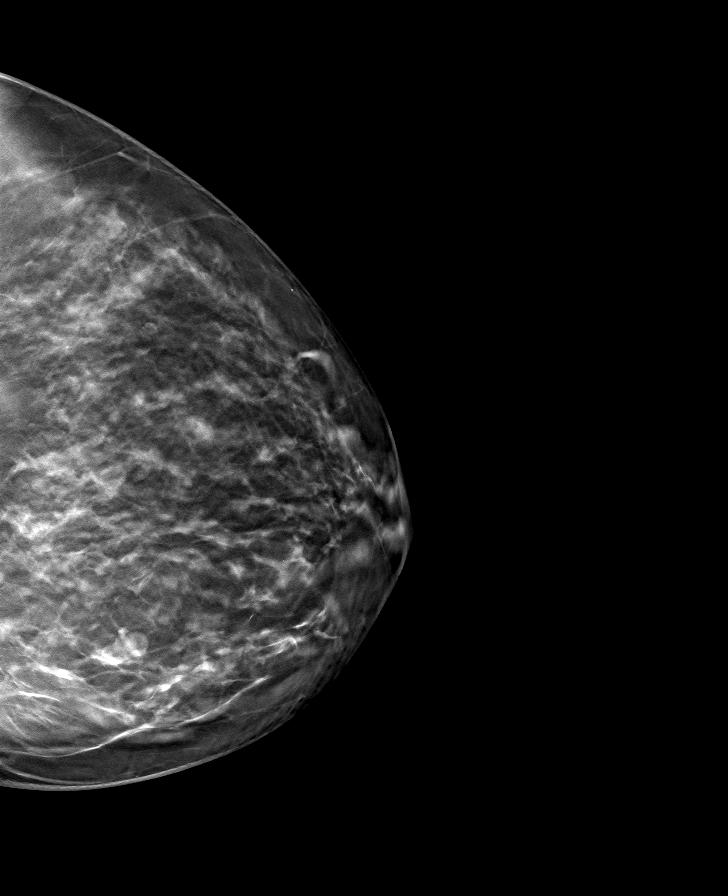

[L MLO tomo · tomo slice 43/86.0]
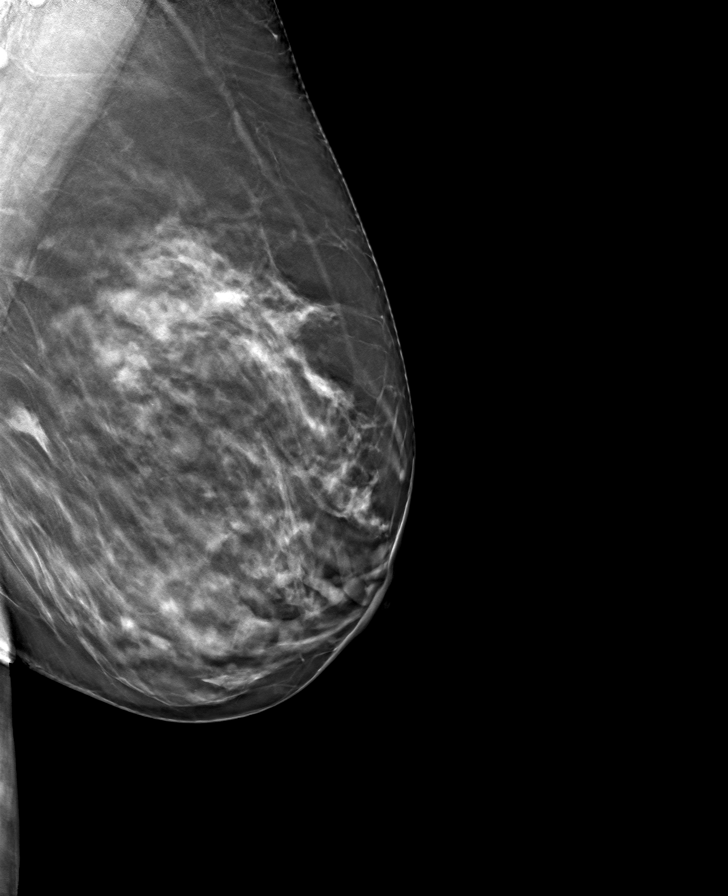

[R MLO tomo · tomo slice 43/84.0]
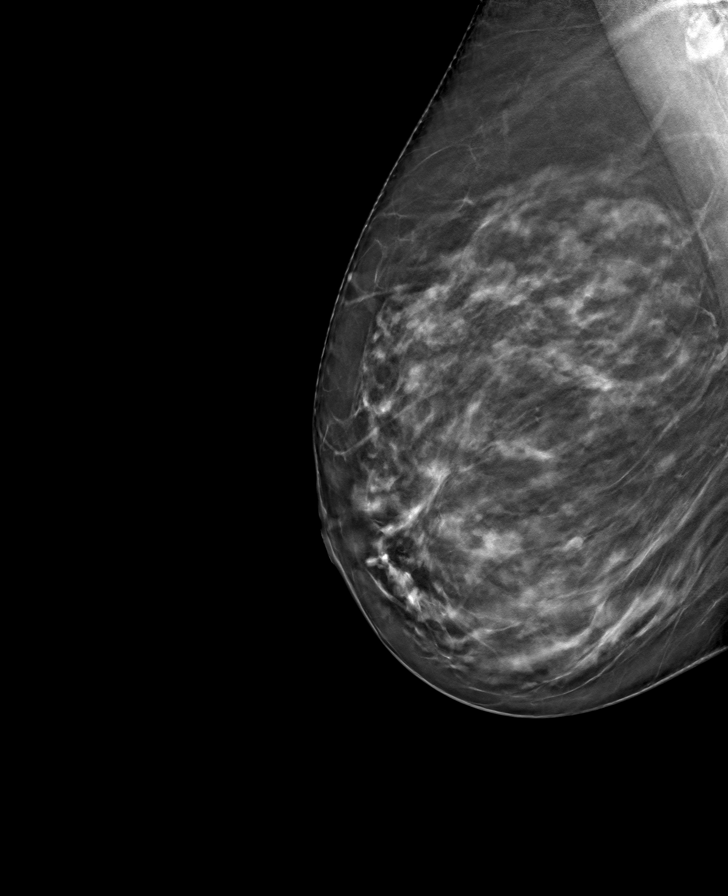

[8 of 24 positions shown; findings below may reference images not displayed]

ACR Breast Density Category c: The breast tissue is heterogeneously
dense, which may obscure small masses.
FINDINGS: There are no findings suspicious for malignancy. Images were
processed with CAD.
IMPRESSION: No mammographic evidence of malignancy. A result letter of this
screening mammogram will be mailed directly to the patient.

RECOMMENDATION:
Screening mammogram in one year. (Code:[id number])

BI-RADS CATEGORY  1: Negative.

## 2021-10-28 DIAGNOSIS — E7800 Pure hypercholesterolemia, unspecified: Secondary | ICD-10-CM | POA: Diagnosis not present

## 2021-10-28 DIAGNOSIS — K219 Gastro-esophageal reflux disease without esophagitis: Secondary | ICD-10-CM | POA: Diagnosis not present

## 2021-10-28 DIAGNOSIS — I10 Essential (primary) hypertension: Secondary | ICD-10-CM | POA: Diagnosis not present

## 2021-10-28 DIAGNOSIS — Z0000 Encounter for general adult medical examination without abnormal findings: Secondary | ICD-10-CM | POA: Diagnosis not present

## 2022-02-17 ENCOUNTER — Other Ambulatory Visit: Payer: Self-pay | Admitting: Obstetrics and Gynecology

## 2022-02-17 DIAGNOSIS — Z1231 Encounter for screening mammogram for malignant neoplasm of breast: Secondary | ICD-10-CM

## 2022-02-23 DIAGNOSIS — N390 Urinary tract infection, site not specified: Secondary | ICD-10-CM | POA: Diagnosis not present

## 2022-02-23 DIAGNOSIS — N3001 Acute cystitis with hematuria: Secondary | ICD-10-CM | POA: Diagnosis not present

## 2022-03-09 DIAGNOSIS — N3001 Acute cystitis with hematuria: Secondary | ICD-10-CM | POA: Diagnosis not present

## 2022-03-25 ENCOUNTER — Ambulatory Visit
Admission: RE | Admit: 2022-03-25 | Discharge: 2022-03-25 | Disposition: A | Discharge status: Home / Self Care | Payer: BC Managed Care – PPO | Source: Ambulatory Visit | Attending: Obstetrics and Gynecology | Admitting: Obstetrics and Gynecology

## 2022-03-25 DIAGNOSIS — Z1231 Encounter for screening mammogram for malignant neoplasm of breast: Secondary | ICD-10-CM

## 2022-04-09 ENCOUNTER — Ambulatory Visit (INDEPENDENT_AMBULATORY_CARE_PROVIDER_SITE_OTHER): Payer: BC Managed Care – PPO | Admitting: Obstetrics and Gynecology

## 2022-04-09 ENCOUNTER — Encounter: Payer: Self-pay | Admitting: Obstetrics and Gynecology

## 2022-04-09 VITALS — BP 118/80 | HR 72 | Ht 64.0 in | Wt 137.0 lb

## 2022-04-09 DIAGNOSIS — Z01419 Encounter for gynecological examination (general) (routine) without abnormal findings: Secondary | ICD-10-CM

## 2022-04-09 NOTE — Patient Instructions (Signed)

## 2022-04-09 NOTE — Progress Notes (Signed)
61 y.o. G75P1001 Married Caucasian female here for annual exam.    Hot flashes improved with the cold weather now.  Taking black cohosh, which is helpful.  PCP:   Carol Ada, MD  No LMP recorded. Patient has had a hysterectomy.           Sexually active: yes The current method of family planning is post menopausal status.    Exercising: Yes.    walking Smoker:  no  Health Maintenance: Pap:   2005 normal  History of abnormal Pap:  no MMG: 03/26/22 -  BI-RADS CATEGORY  1: Negative.  Colonoscopy:  Dr. Oletta Lamas at [facility] 07/2014 benign polyps;next due 07/2024   BMD:   n/a TDaP:  01/20/2019 Gardasil:   no HIV:  2017 - neg Hep C:  2017 - neg Screening Labs:  PCP Flu vaccine:  declined Covid vaccine: discussed RSV vaccine:  discussed Shingrix:  discussed   reports that she has never smoked. She has never used smokeless tobacco. She reports that she does not drink alcohol and does not use drugs.  Past Medical History:  Diagnosis Date   Breast cyst 02/2011   rigth breast   Diarrhea    History of COVID-19 05/2019   Hyperlipidemia 2017   Hypertension    IBS (irritable bowel syndrome)    Plantar fasciitis    Rosacea    Tendinitis    Rt arm 2011, Lt arm 01/2012   Vitamin D deficiency 2016   Weight loss, unintentional     Past Surgical History:  Procedure Laterality Date   BREAST CYST ASPIRATION Left 2011   5cc   BREAST EXCISIONAL BIOPSY Right    BREAST SURGERY Right 03/09/11   exc of right breast duct system    CHOLECYSTECTOMY  03/25/11   Laparoscopy   LAPAROSCOPIC CHOLECYSTECTOMY  2002   VAGINAL HYSTERECTOMY  1998   DUB    Current Outpatient Medications  Medication Sig Dispense Refill   Acetaminophen (TYLENOL PO) Take by mouth as needed.     Black Cohosh 20 MG TABS Take 1 tablet by mouth 2 (two) times daily.     Calcium Carbonate-Vitamin D (CALCIUM 600+D PO) Take by mouth daily. Has '800mg'$  of vitamin D3     Cholecalciferol (VITAMIN D3) 2000 units TABS Take 1 tablet  by mouth daily.     famotidine (PEPCID) 20 MG tablet Take 20 mg by mouth 2 (two) times daily.     Fexofenadine HCl (ALLEGRA PO) Take by mouth as needed.       lisinopril (ZESTRIL) 10 MG tablet 1 tablet     Polyethyl Glycol-Propyl Glycol (SYSTANE) 0.4-0.3 % SOLN 1 drop into affected eye as needed     No current facility-administered medications for this visit.    Family History  Problem Relation Age of Onset   Hypertension Mother    COPD Mother    Fibromyalgia Mother    Hyperlipidemia Father    Breast cancer Maternal Grandmother     Review of Systems  All other systems reviewed and are negative.   Exam:   BP 118/80 (BP Location: Right Arm, Patient Position: Sitting, Cuff Size: Normal)   Pulse 72   Ht '5\' 4"'$  (1.626 m)   Wt 137 lb (62.1 kg)   BMI 23.52 kg/m     General appearance: alert, cooperative and appears stated age Head: normocephalic, without obvious abnormality, atraumatic Neck: no adenopathy, supple, symmetrical, trachea midline and thyroid normal to inspection and palpation Lungs: clear to auscultation bilaterally  Breasts: normal appearance, no masses or tenderness, No nipple retraction or dimpling, No nipple discharge or bleeding, No axillary adenopathy Heart: regular rate and rhythm Abdomen: soft, non-tender; no masses, no organomegaly Extremities: extremities normal, atraumatic, no cyanosis or edema Skin: skin color, texture, turgor normal. No rashes or lesions Lymph nodes: cervical, supraclavicular, and axillary nodes normal. Neurologic: grossly normal  Pelvic: External genitalia:  no lesions              No abnormal inguinal nodes palpated.              Urethra:  normal appearing urethra with no masses, tenderness or lesions              Bartholins and Skenes: normal                 Vagina: normal appearing vagina with normal color and discharge, no lesions              Cervix:  absent              Pap taken: no Bimanual Exam:  Uterus:  absent               Adnexa: no mass, fullness, tenderness              Rectal exam: yes.  Confirms.              Anus:  normal sphincter tone, no lesions  Chaperone was present for exam:  Kimalexis  Assessment:   Well woman visit with gynecologic exam. Status post total vaginal hysterectomy.  Ovaries remain.  Menopausal symptoms.  Improved.  Taking [employer].  Side effects from Effexor and Gabapentin, so stopped. Hyperlipidemia.  HTN.  Plan: Mammogram screening discussed. Self breast awareness reviewed. Pap and HR HPV as above. Guidelines for Calcium, Vitamin D, regular exercise program including cardiovascular and weight bearing exercise. Labs with PCP.  Will get a copy of her last colonoscopy.  Follow up annually and prn.   After visit summary provided.

## 2022-05-07 DIAGNOSIS — E7800 Pure hypercholesterolemia, unspecified: Secondary | ICD-10-CM | POA: Diagnosis not present

## 2022-05-07 DIAGNOSIS — J301 Allergic rhinitis due to pollen: Secondary | ICD-10-CM | POA: Diagnosis not present

## 2022-05-07 DIAGNOSIS — K219 Gastro-esophageal reflux disease without esophagitis: Secondary | ICD-10-CM | POA: Diagnosis not present

## 2022-05-07 DIAGNOSIS — I10 Essential (primary) hypertension: Secondary | ICD-10-CM | POA: Diagnosis not present

## 2022-08-08 ENCOUNTER — Other Ambulatory Visit: Payer: Self-pay

## 2022-08-08 ENCOUNTER — Emergency Department (HOSPITAL_BASED_OUTPATIENT_CLINIC_OR_DEPARTMENT_OTHER): Payer: BC Managed Care – PPO

## 2022-08-08 ENCOUNTER — Emergency Department (HOSPITAL_BASED_OUTPATIENT_CLINIC_OR_DEPARTMENT_OTHER)
Admission: EM | Admit: 2022-08-08 | Discharge: 2022-08-08 | Disposition: A | Discharge status: Home / Self Care | Payer: BC Managed Care – PPO | Attending: Emergency Medicine | Admitting: Emergency Medicine

## 2022-08-08 ENCOUNTER — Encounter (HOSPITAL_BASED_OUTPATIENT_CLINIC_OR_DEPARTMENT_OTHER): Payer: Self-pay | Admitting: Urology

## 2022-08-08 DIAGNOSIS — K5730 Diverticulosis of large intestine without perforation or abscess without bleeding: Secondary | ICD-10-CM | POA: Diagnosis not present

## 2022-08-08 DIAGNOSIS — I700 Atherosclerosis of aorta: Secondary | ICD-10-CM | POA: Diagnosis not present

## 2022-08-08 DIAGNOSIS — N3001 Acute cystitis with hematuria: Secondary | ICD-10-CM | POA: Insufficient documentation

## 2022-08-08 DIAGNOSIS — R300 Dysuria: Secondary | ICD-10-CM | POA: Diagnosis not present

## 2022-08-08 DIAGNOSIS — R1031 Right lower quadrant pain: Secondary | ICD-10-CM | POA: Diagnosis not present

## 2022-08-08 LAB — CBC WITH DIFFERENTIAL/PLATELET
Abs Immature Granulocytes: 0.01 10*3/uL (ref 0.00–0.07)
Basophils Absolute: 0 10*3/uL (ref 0.0–0.1)
Basophils Relative: 1 %
Eosinophils Absolute: 0.2 10*3/uL (ref 0.0–0.5)
Eosinophils Relative: 2 %
HCT: 39.8 % (ref 36.0–46.0)
Hemoglobin: 12.8 g/dL (ref 12.0–15.0)
Immature Granulocytes: 0 %
Lymphocytes Relative: 21 %
Lymphs Abs: 1.6 10*3/uL (ref 0.7–4.0)
MCH: 28.4 pg (ref 26.0–34.0)
MCHC: 32.2 g/dL (ref 30.0–36.0)
MCV: 88.4 fL (ref 80.0–100.0)
Monocytes Absolute: 0.8 10*3/uL (ref 0.1–1.0)
Monocytes Relative: 10 %
Neutro Abs: 5 10*3/uL (ref 1.7–7.7)
Neutrophils Relative %: 66 %
Platelets: 246 10*3/uL (ref 150–400)
RBC: 4.5 MIL/uL (ref 3.87–5.11)
RDW: 12.7 % (ref 11.5–15.5)
WBC: 7.6 10*3/uL (ref 4.0–10.5)
nRBC: 0 % (ref 0.0–0.2)

## 2022-08-08 LAB — URINALYSIS, ROUTINE W REFLEX MICROSCOPIC
Bilirubin Urine: NEGATIVE
Glucose, UA: NEGATIVE mg/dL
Ketones, ur: NEGATIVE mg/dL
Nitrite: POSITIVE — AB
Protein, ur: >=300 mg/dL — AB
Specific Gravity, Urine: 1.025 (ref 1.005–1.030)
pH: 5.5 (ref 5.0–8.0)

## 2022-08-08 LAB — COMPREHENSIVE METABOLIC PANEL
ALT: 17 U/L (ref 0–44)
AST: 23 U/L (ref 15–41)
Albumin: 3.9 g/dL (ref 3.5–5.0)
Alkaline Phosphatase: 100 U/L (ref 38–126)
Anion gap: 7 (ref 5–15)
BUN: 19 mg/dL (ref 8–23)
CO2: 24 mmol/L (ref 22–32)
Calcium: 8.6 mg/dL — ABNORMAL LOW (ref 8.9–10.3)
Chloride: 106 mmol/L (ref 98–111)
Creatinine, Ser: 0.68 mg/dL (ref 0.44–1.00)
GFR, Estimated: >60 mL/min (ref >60)
Glucose, Bld: 104 mg/dL — ABNORMAL HIGH (ref 70–99)
Potassium: 3.3 mmol/L — ABNORMAL LOW (ref 3.5–5.1)
Sodium: 137 mmol/L (ref 135–145)
Total Bilirubin: 0.8 mg/dL (ref 0.3–1.2)
Total Protein: 7.5 g/dL (ref 6.5–8.1)

## 2022-08-08 LAB — URINALYSIS, MICROSCOPIC (REFLEX): WBC, UA: >50 WBC/hpf (ref 0–5)

## 2022-08-08 LAB — LIPASE, BLOOD: Lipase: 30 U/L (ref 11–51)

## 2022-08-08 MED ORDER — SULFAMETHOXAZOLE-TRIMETHOPRIM 800-160 MG PO TABS: 1.0000 | ORAL_TABLET | Freq: Two times a day (BID) | ORAL | 0 refills | Status: AC

## 2022-08-08 MED ORDER — IOHEXOL 300 MG/ML  SOLN
80.0000 mL | Freq: Once | INTRAMUSCULAR | Status: AC | PRN
  Administered 2022-08-08: 80 mL via INTRAVENOUS

## 2022-08-08 NOTE — ED Provider Notes (Signed)
[facility] Provider Note   CSN: [id number] Arrival date & time: 08/08/22  1056     History  Chief Complaint  Patient presents with   Dysuria    Denise Gibson is a 62 y.o. female who presents to the ED with concerns for dysuria onset this week. Notes history of UTIs. No history of kidney stones.  No measured prior to arrival.  Has associated right lower quadrant abdominal pain that started last night.  Has associated urinary frequency.  Patient still has her appendix.  Denies fever, vaginal bleeding, vaginal discharge, hematuria, nausea, vomiting.  The history is provided by the patient. No language interpreter was used.       Home Medications Prior to Admission medications   Medication Sig Start Date End Date Taking? Authorizing Provider  sulfamethoxazole-trimethoprim (BACTRIM DS) 800-160 MG tablet Take 1 tablet by mouth 2 (two) times daily for 7 days. 08/08/22 08/15/22 Yes Prosper Paff A, PA-C  Acetaminophen (TYLENOL PO) Take by mouth as needed.    [provider]  Black Cohosh 20 MG TABS Take 1 tablet by mouth 2 (two) times daily.    [provider]  Calcium Carbonate-Vitamin D (CALCIUM 600+D PO) Take by mouth daily. Has 800mg  of vitamin D3    [provider]  Cholecalciferol (VITAMIN D3) 2000 units TABS Take 1 tablet by mouth daily.    [provider]  famotidine (PEPCID) 20 MG tablet Take 20 mg by mouth 2 (two) times daily.    [provider]  Fexofenadine HCl (ALLEGRA PO) Take by mouth as needed.      [provider]  lisinopril (ZESTRIL) 10 MG tablet 1 tablet    [provider]  Polyethyl Glycol-Propyl Glycol (SYSTANE) 0.4-0.3 % SOLN 1 drop into affected eye as needed    [provider]      Allergies    Patient has no known allergies.    Review of Systems   Review of Systems  Genitourinary:  Positive for dysuria.  All other systems reviewed and are  negative.   Physical Exam Updated Vital Signs BP 127/86 (BP Location: Left Arm)   Pulse 91   Temp 98.2 F (36.8 C) (Oral)   Resp 18   Ht 5\' 4"  (1.626 m)   Wt 62.1 kg   SpO2 99%   BMI 23.50 kg/m  Physical Exam Vitals and nursing note reviewed.  Constitutional:      General: She is not in acute distress.    Appearance: Normal appearance.  Eyes:     General: No scleral icterus.    Extraocular Movements: Extraocular movements intact.  Cardiovascular:     Rate and Rhythm: Normal rate.  Pulmonary:     Effort: Pulmonary effort is normal. No respiratory distress.  Abdominal:     Palpations: Abdomen is soft. There is no mass.     Tenderness: There is abdominal tenderness in the right lower quadrant. Negative signs include Rovsing's sign and psoas sign.     Comments: Mild RLQ TTP.   Musculoskeletal:        General: Normal range of motion.     Cervical back: Neck supple.  Skin:    General: Skin is warm and dry.     Findings: No rash.  Neurological:     Mental Status: She is alert.     Sensory: Sensation is intact.     Motor: Motor function is intact.  Psychiatric:  Behavior: Behavior normal.     ED Results / Procedures / Treatments   Labs (all labs ordered are listed, but only abnormal results are displayed) Labs Reviewed  URINALYSIS, ROUTINE W REFLEX MICROSCOPIC - Abnormal; Notable for the following components:      Result Value   APPearance CLOUDY (*)    Hgb urine dipstick MODERATE (*)    Protein, ur >=300 (*)    Nitrite POSITIVE (*)    Leukocytes,Ua MODERATE (*)    All other components within normal limits  COMPREHENSIVE METABOLIC PANEL - Abnormal; Notable for the following components:   Potassium 3.3 (*)    Glucose, Bld 104 (*)    Calcium 8.6 (*)    All other components within normal limits  URINALYSIS, MICROSCOPIC (REFLEX) - Abnormal; Notable for the following components:   Bacteria, UA MANY (*)    Non Squamous Epithelial PRESENT (*)    All other  components within normal limits  URINE CULTURE  CBC WITH DIFFERENTIAL/PLATELET  LIPASE, BLOOD    EKG None  Radiology CT ABDOMEN PELVIS W CONTRAST  Result Date: 08/08/2022 CLINICAL DATA:  Right lower quadrant abdominal pain. EXAM: CT ABDOMEN AND PELVIS WITH CONTRAST TECHNIQUE: Multidetector CT imaging of the abdomen and pelvis was performed using the standard protocol following bolus administration of intravenous contrast. RADIATION DOSE REDUCTION: This exam was performed according to the departmental dose-optimization program which includes automated exposure control, adjustment of the mA and/or kV according to patient size and/or use of iterative reconstruction technique. CONTRAST:  5mL OMNIPAQUE IOHEXOL 300 MG/ML  SOLN COMPARISON:  None Available. FINDINGS: Lower chest: Unremarkable. Hepatobiliary: No suspicious focal abnormality within the liver parenchyma. Gallbladder surgically absent. No intrahepatic or extrahepatic biliary dilation. Pancreas: No focal mass lesion. No dilatation of the main duct. No intraparenchymal cyst. No peripancreatic edema. Spleen: No splenomegaly. No focal mass lesion. Adrenals/Urinary Tract: No adrenal nodule or mass. Tiny hypoattenuating lesions in both kidneys are too small to characterize, most likely benign cysts. No followup imaging is recommended. Mild fullness of the right intrarenal collecting system and right ureter is associated with circumferential mural enhancement in the right renal pelvis and right ureter down to the level of the right UVJ which appears thickened and shows increased enhancement relative to the contralateral side. The left ureter is completely decompressed without evidence for mural enhancement. Bladder wall is ill-defined and borderline thickened. The urinary bladder appears normal for the degree of distention. Stomach/Bowel: Stomach is unremarkable. No gastric wall thickening. No evidence of outlet obstruction. Duodenum is normally  positioned as is the ligament of Treitz. No small bowel wall thickening. No small bowel dilatation. The terminal ileum is normal. The appendix is normal. No gross colonic mass. No colonic wall thickening. Diverticular changes are noted in the left colon without evidence of diverticulitis. Vascular/Lymphatic: There is mild atherosclerotic calcification of the abdominal aorta without aneurysm. There is no gastrohepatic or hepatoduodenal ligament lymphadenopathy. No retroperitoneal or mesenteric lymphadenopathy. No pelvic sidewall lymphadenopathy. Reproductive: Hysterectomy.  There is no adnexal mass. Other: No intraperitoneal free fluid. Musculoskeletal: No worrisome lytic or sclerotic osseous abnormality. IMPRESSION: 1. Subtle, mild fullness of the right intrarenal collecting system and right ureter is associated with circumferential mural enhancement in the right renal pelvis and right ureter down to the level of the right UVJ which appears thickened and shows increased enhancement relative to the contralateral side. Findings could reflect recent stone passage or right-sided urinary tract infection/inflammation. Soft tissue lesion at the right UVJ not entirely excluded. Close  follow-up recommended urology consult likely warranted. 2. Bladder wall is ill-defined and borderline thickened. Imaging features could be related to cystitis. 3. Left colonic diverticulosis without diverticulitis. 4.  Aortic Atherosclerosis (ICD10-I70.0). Electronically Signed   By: Misty Stanley M.D.   On: 08/08/2022 13:28    Procedures Procedures    Medications Ordered in ED Medications  iohexol (OMNIPAQUE) 300 MG/ML solution 80 mL (80 mLs Intravenous Contrast Given 08/08/22 1259)    ED Course/ Medical Decision Making/ A&P Clinical Course as of 08/08/22 1844  Sat Aug 08, 2022  1344 Discussed with patient lab and imaging results.  Patient notes that Bactrim works best for her.  Will send a prescription for Bactrim to patient's  pharmacy. [SB]    Clinical Course User Index [SB] Kyron Schlitt A, PA-C                             Medical Decision Making Amount and/or Complexity of Data Reviewed Labs: ordered. Radiology: ordered.  Risk Prescription drug management.   Pt presents with urinary symptoms and RLQ pain. History of UTIs. Pt afebrile. On exam, pt with mild RLQ TTP. No acute cardiovascular, respiratory, abdominal exam findings. Differential diagnosis includes appendicitis, pyelonephritis, nephrolithiasis, acute cystitis, diverticulitis.    Labs:  I ordered, and personally interpreted labs.  The pertinent results include:   Lipase negative CBC without leukocytosis CMP unremarkable Urinalysis notable for acute cystitis  Imaging: I ordered imaging studies including CT abdomen pelvis with I independently visualized and interpreted imaging which showed:  1. Subtle, mild fullness of the right intrarenal collecting system  and right ureter is associated with circumferential mural  enhancement in the right renal pelvis and right ureter down to the  level of the right UVJ which appears thickened and shows increased  enhancement relative to the contralateral side. Findings could  reflect recent stone passage or right-sided urinary tract  infection/inflammation. Soft tissue lesion at the right UVJ not  entirely excluded. Close follow-up recommended urology consult  likely warranted.  2. Bladder wall is ill-defined and borderline thickened. Imaging  features could be related to cystitis.  3. Left colonic diverticulosis without diverticulitis.  4.  Aortic Atherosclerosis (ICD10-I70.0).   I agree with the radiologist interpretation  Disposition: Presentation suspicious for acute cystitis with hematuria.  Doubt concerns at this time for pyelonephritis or nephrolithiasis.  Doubt concerns at this time for appendicitis. After consideration of the diagnostic results and the patients response to treatment, I  feel that the patient would benefit from Discharge home.  Patient sent with a prescription for Bactrim as patient notes that only this medication helps with her UTIs.  Patient provided with information for urology for follow-up regarding incidental findings on CT scan as well as follow-up for her acute cystitis.  Instructed patient to follow-up with her primary care provider regarding today's ED visit.  Supportive care measures and strict return precautions discussed with patient at bedside. Pt acknowledges and verbalizes understanding. Pt appears safe for discharge. Follow up as indicated in discharge paperwork.    This chart was dictated using voice recognition software, Dragon. Despite the best efforts of this provider to proofread and correct errors, errors may still occur which can change documentation meaning.   Final Clinical Impression(s) / ED Diagnoses Final diagnoses:  Acute cystitis with hematuria    Rx / DC Orders ED Discharge Orders          Ordered    sulfamethoxazole-trimethoprim (BACTRIM  DS) 800-160 MG tablet  2 times daily        08/08/22 1355              Acsa Estey A, PA-C 08/08/22 1845    Dorie Rank, MD 08/09/22 [phone]

## 2022-08-08 NOTE — Discharge Instructions (Addendum)
It was a pleasure taking care of you today!   Your labs were unremarkable. Your urine was notable for a UTI. A culture of your urine will be sent today. You will be sent a prescription for Bactrim, take as prescribed. Ensure that you complete the entire course of antibiotic. Ensure to maintain fluid intake. Attached is information for [facility] to follow-up regarding the incidental findings on the CT concerning for lesion at the right UVJ.  Call and set up a follow up appointment this week. You may follow-up with your primary care provider as needed.  Return to the emergency department if you are experiencing increasing or worsening abdominal pain, urinary symptoms, fever, or decreased fluid intake.

## 2022-08-08 NOTE — ED Triage Notes (Signed)
Pt states pressure with urination that started last night  Denies any urgency or frequency at this time   H/o frequent urination

## 2022-08-11 LAB — URINE CULTURE: Culture: >=100000 — AB

## 2022-08-12 ENCOUNTER — Telehealth (HOSPITAL_BASED_OUTPATIENT_CLINIC_OR_DEPARTMENT_OTHER): Payer: Self-pay | Admitting: *Deleted

## 2022-08-12 NOTE — Telephone Encounter (Signed)
Post ED Visit - Positive Culture Follow-up  Culture report reviewed by antimicrobial stewardship pharmacist: [facility] Team []  Elenor Quinones, Pharm.D. []  Heide Guile, Pharm.D., BCPS AQ-ID []  Parks Neptune, Pharm.D., BCPS []  Alycia Rossetti, Pharm.D., BCPS []  [place], Pharm.D., BCPS, AAHIVP []  Legrand Como, Pharm.D., BCPS, AAHIVP []  Salome Arnt, PharmD, BCPS []  Johnnette Gourd, PharmD, BCPS []  Hughes Better, PharmD, BCPS []  Leeroy Cha, PharmD []  Laqueta Linden, PharmD, BCPS []  Albertina Parr, PharmD  [facility] Team []  Leodis Sias, PharmD []  Lindell Spar, PharmD []  Royetta Asal, PharmD []  Graylin Shiver, Rph []  Rema Fendt) Glennon Mac, PharmD []  Arlyn Dunning, PharmD []  Netta Cedars, PharmD []  Dia Sitter, PharmD []  Leone Haven, PharmD []  Gretta Arab, PharmD []  Theodis Shove, PharmD []  Peggyann Juba, PharmD []  Reuel Boom, PharmD   Positive urine culture Treated with Sulfamethoxazole-Trimethoprim, organism sensitive to the same and no further patient follow-up is required at this time.  Reviewed by Rosana Hoes, Danny Lawless 08/12/2022, 8:33 AM

## 2022-08-25 ENCOUNTER — Ambulatory Visit: Payer: BC Managed Care – PPO | Admitting: Urology

## 2022-08-25 ENCOUNTER — Encounter: Payer: Self-pay | Admitting: Urology

## 2022-08-25 VITALS — BP 137/84 | HR 72 | Ht 63.0 in | Wt 137.0 lb

## 2022-08-25 DIAGNOSIS — Z87440 Personal history of urinary (tract) infections: Secondary | ICD-10-CM

## 2022-08-25 DIAGNOSIS — R9389 Abnormal findings on diagnostic imaging of other specified body structures: Secondary | ICD-10-CM | POA: Diagnosis not present

## 2022-08-25 DIAGNOSIS — N390 Urinary tract infection, site not specified: Secondary | ICD-10-CM

## 2022-08-25 LAB — BLADDER SCAN AMB NON-IMAGING

## 2022-08-25 MED ORDER — NITROFURANTOIN MONOHYD MACRO 100 MG PO CAPS: 100.0000 mg | ORAL_CAPSULE | Freq: Every day | ORAL | 1 refills | Status: DC

## 2022-08-25 NOTE — Progress Notes (Signed)
Assessment: 1. Frequent UTI   2. Abnormal finding on CT scan     Plan: I personally reviewed the patient's chart including provider notes, lab and imaging results. I personally viewed the CT study from 08/08/2022 with results as noted below.  CT appears to be consistent with an ascending UTI. Methods to reduce the risk of UTI's discussed including increased fluid intake, timed and double voiding, daily cranberry supplement, daily probiotic, vaginal hormone cream, and antibiotic suppression. She does not wish to begin the vaginal hormone cream at this time. Begin daily Macrobid for UTI prevention Return to office in 4-6 weeks Will arrange for repeat CT after next visit  Chief Complaint:  Chief Complaint  Patient presents with   Urinary Tract Infection    History of Present Illness:  Denise Gibson is a 62 y.o. female who is seen in consultation from Carol Ada, MD for evaluation of UTI and abnormal CT findings. She reports a history of 4 UTIs within the past year.  These have been treated by her primary care provider.  No culture results available.  She typically has symptoms of frequency, urgency, dysuria.  No history of flank pain, fever, or chills.  Her symptoms typically resolve with antibiotic therapy. She presented to the emergency room on 08/08/2022 with right lower quadrant abdominal pain, and increased urinary frequency.  She was not having any dysuria, gross hematuria, flank pain, fever, or chills.   Urine culture grew >100 K E. coli.  She was treated with Bactrim. CT abdomen and pelvis with contrast showed small hypoattenuating lesions in both kidneys too small to characterize, mild fullness of the right intrarenal collecting system and right ureter associated with preferential mural enhancement in the right renal pelvis and right ureter to the level of the right UVJ which appeared thickened with increased enhancement.  Her symptoms resolved following antibiotic  therapy.  She is no longer having any right lower quadrant pain. No dysuria or gross hematuria. No prior history of kidney stones.  No history of UTIs prior to the past year.  Report occasional stress incontinence.  She does not require any pad usage.   Past Medical History:  Past Medical History:  Diagnosis Date   Breast cyst 02/2011   rigth breast   Diarrhea    History of COVID-19 05/2019   Hyperlipidemia 2017   Hypertension    IBS (irritable bowel syndrome)    Plantar fasciitis    Rosacea    Tendinitis    Rt arm 2011, Lt arm 01/2012   Vitamin D deficiency 2016   Weight loss, unintentional     Past Surgical History:  Past Surgical History:  Procedure Laterality Date   BREAST CYST ASPIRATION Left 2011   5cc   BREAST EXCISIONAL BIOPSY Right    BREAST SURGERY Right 03/09/11   exc of right breast duct system    CHOLECYSTECTOMY  03/25/11   Laparoscopy   LAPAROSCOPIC CHOLECYSTECTOMY  2002   VAGINAL HYSTERECTOMY  1998   DUB    Allergies:  No Known Allergies  Family History:  Family History  Problem Relation Age of Onset   Hypertension Mother    COPD Mother    Fibromyalgia Mother    Hyperlipidemia Father    Breast cancer Maternal Grandmother     Social History:  Social History   Tobacco Use   Smoking status: Never   Smokeless tobacco: Never  Vaping Use   Vaping Use: Never used  Substance Use Topics  Alcohol use: No    Alcohol/week: 0.0 standard drinks of alcohol   Drug use: No    Review of symptoms:  Constitutional:  Negative for unexplained weight loss, night sweats, fever, chills ENT:  Negative for nose bleeds, sinus pain, painful swallowing CV:  Negative for chest pain, shortness of breath, exercise intolerance, palpitations, loss of consciousness Resp:  Negative for cough, wheezing, shortness of breath GI:  Negative for nausea, vomiting, diarrhea, bloody stools GU:  Positives noted in HPI; otherwise negative for gross hematuria Neuro:  Negative  for seizures, poor balance, limb weakness, slurred speech Psych:  Negative for lack of energy, depression, anxiety Endocrine:  Negative for polydipsia, polyuria, symptoms of hypoglycemia (dizziness, hunger, sweating) Hematologic:  Negative for anemia, purpura, petechia, prolonged or excessive bleeding, use of anticoagulants  Allergic:  Negative for difficulty breathing or choking as a result of exposure to anything; no shellfish allergy; no allergic response (rash/itch) to materials, foods  Physical exam: BP 137/84   Pulse 72   Ht 5\' 3"  (1.6 m)   Wt 137 lb (62.1 kg)   BMI 24.27 kg/m  GENERAL APPEARANCE:  Well appearing, well developed, well nourished, NAD HEENT: Atraumatic, Normocephalic, oropharynx clear. NECK: Supple without lymphadenopathy or thyromegaly. LUNGS: Clear to auscultation bilaterally. HEART: Regular Rate and Rhythm without murmurs, gallops, or rubs. ABDOMEN: Soft, non-tender, No Masses. EXTREMITIES: Moves all extremities well.  Without clubbing, cyanosis, or edema. NEUROLOGIC:  Alert and oriented x 3, normal gait, CN II-XII grossly intact.  MENTAL STATUS:  Appropriate. BACK:  Non-tender to palpation.  No CVAT SKIN:  Warm, dry and intact.    Results: U/A:  0-5 WBC, 0 RBC, few bacteria, nitrite negative  PVR = 40 ml

## 2022-08-26 LAB — MICROSCOPIC EXAMINATION
Cast Type: NONE SEEN
Casts: NONE SEEN /lpf
Crystal Type: NONE SEEN
Crystals: NONE SEEN
RBC, Urine: NONE SEEN /hpf (ref 0–2)
Renal Epithel, UA: NONE SEEN /hpf
Trichomonas, UA: NONE SEEN
Yeast, UA: NONE SEEN

## 2022-08-26 LAB — URINALYSIS, ROUTINE W REFLEX MICROSCOPIC
Bilirubin, UA: NEGATIVE
Glucose, UA: NEGATIVE
Ketones, UA: NEGATIVE
Nitrite, UA: NEGATIVE
Protein,UA: NEGATIVE
RBC, UA: NEGATIVE
Specific Gravity, UA: 1.02 (ref 1.005–1.030)
Urobilinogen, Ur: 0.2 mg/dL (ref 0.2–1.0)
pH, UA: 5.5 (ref 5.0–7.5)

## 2022-09-09 DIAGNOSIS — L299 Pruritus, unspecified: Secondary | ICD-10-CM | POA: Diagnosis not present

## 2022-09-09 DIAGNOSIS — E7800 Pure hypercholesterolemia, unspecified: Secondary | ICD-10-CM | POA: Diagnosis not present

## 2022-09-22 ENCOUNTER — Encounter: Payer: Self-pay | Admitting: Urology

## 2022-09-22 ENCOUNTER — Ambulatory Visit: Payer: BC Managed Care – PPO | Admitting: Urology

## 2022-09-22 VITALS — BP 138/88 | HR 71 | Ht 63.0 in | Wt 141.0 lb

## 2022-09-22 DIAGNOSIS — N393 Stress incontinence (female) (male): Secondary | ICD-10-CM | POA: Diagnosis not present

## 2022-09-22 DIAGNOSIS — N390 Urinary tract infection, site not specified: Secondary | ICD-10-CM

## 2022-09-22 DIAGNOSIS — R9389 Abnormal findings on diagnostic imaging of other specified body structures: Secondary | ICD-10-CM

## 2022-09-22 DIAGNOSIS — Z87440 Personal history of urinary (tract) infections: Secondary | ICD-10-CM

## 2022-09-22 LAB — URINALYSIS, ROUTINE W REFLEX MICROSCOPIC
Bilirubin, UA: NEGATIVE
Glucose, UA: NEGATIVE
Ketones, UA: NEGATIVE
Leukocytes,UA: NEGATIVE
Nitrite, UA: NEGATIVE
Protein,UA: NEGATIVE
RBC, UA: NEGATIVE
Specific Gravity, UA: 1.025 (ref 1.005–1.030)
Urobilinogen, Ur: 0.2 mg/dL (ref 0.2–1.0)
pH, UA: 6 (ref 5.0–7.5)

## 2022-09-22 NOTE — Progress Notes (Signed)
Assessment: 1. Frequent UTI   2. Abnormal finding on CT scan     Plan: Continue methods to reduce the risk of UTI's discussed including increased fluid intake, timed and double voiding, daily cranberry supplement, daily probiotic, vaginal hormone cream, and antibiotic suppression. Complete current supply of  daily Macrobid for UTI prevention CT hematuria protocol for re-evaluation of right ureteral abnormalities seen on prior CT. Will contact her with results Return to office in 6 weeks  Chief Complaint:  Chief Complaint  Patient presents with   Frequent UTI    History of Present Illness:  Denise Gibson is a 62 y.o. female who is seen for further evaluation of UTI and abnormal CT findings. She reported a history of 4 UTIs within the past year.  These have been treated by her primary care provider.  No culture results available.  She typically has symptoms of frequency, urgency, dysuria.  No history of flank pain, fever, or chills.  Her symptoms typically resolve with antibiotic therapy. She presented to the emergency room on 08/08/2022 with right lower quadrant abdominal pain, and increased urinary frequency.  She was not having any dysuria, gross hematuria, flank pain, fever, or chills.   Urine culture grew >100 K E. coli.  She was treated with Bactrim. CT abdomen and pelvis with contrast showed small hypoattenuating lesions in both kidneys too small to characterize, mild fullness of the right intrarenal collecting system and right ureter associated with preferential mural enhancement in the right renal pelvis and right ureter to the level of the right UVJ which appeared thickened with increased enhancement.  Her symptoms resolved following antibiotic therapy.  She was no longer having any right lower quadrant pain. No dysuria or gross hematuria. No prior history of kidney stones.  No history of UTIs prior to the past year.   She reported occasional stress incontinence but does  not require any pad usage.  She returns today for follow-up.  She continues on daily Macrobid.  No UTI symptoms.  Her right lower abdominal pain has completely resolved.  No dysuria or gross hematuria.  She continues with occasional stress incontinence.  Portions of the above documentation were copied from a prior visit for review purposes only.  Past Medical History:  Past Medical History:  Diagnosis Date   Breast cyst 02/2011   rigth breast   Diarrhea    History of COVID-19 05/2019   Hyperlipidemia 2017   Hypertension    IBS (irritable bowel syndrome)    Plantar fasciitis    Rosacea    Tendinitis    Rt arm 2011, Lt arm 01/2012   Vitamin D deficiency 2016   Weight loss, unintentional     Past Surgical History:  Past Surgical History:  Procedure Laterality Date   BREAST CYST ASPIRATION Left 2011   5cc   BREAST EXCISIONAL BIOPSY Right    BREAST SURGERY Right 03/09/11   exc of right breast duct system    CHOLECYSTECTOMY  03/25/11   Laparoscopy   LAPAROSCOPIC CHOLECYSTECTOMY  2002   VAGINAL HYSTERECTOMY  1998   DUB    Allergies:  No Known Allergies  Family History:  Family History  Problem Relation Age of Onset   Hypertension Mother    COPD Mother    Fibromyalgia Mother    Hyperlipidemia Father    Breast cancer Maternal Grandmother     Social History:  Social History   Tobacco Use   Smoking status: Never   Smokeless tobacco: Never  Vaping Use   Vaping Use: Never used  Substance Use Topics   Alcohol use: No    Alcohol/week: 0.0 standard drinks of alcohol   Drug use: No    ROS: Constitutional:  Negative for fever, chills, weight loss CV: Negative for chest pain, previous MI, hypertension Respiratory:  Negative for shortness of breath, wheezing, sleep apnea, frequent cough GI:  Negative for nausea, vomiting, bloody stool, GERD  Physical exam: BP 138/88   Pulse 71   Ht 5\' 3"  (1.6 m)   Wt 141 lb (64 kg)   BMI 24.98 kg/m  GENERAL APPEARANCE:  Well  appearing, well developed, well nourished, NAD HEENT:  Atraumatic, normocephalic, oropharynx clear NECK:  Supple without lymphadenopathy or thyromegaly ABDOMEN:  Soft, non-tender, no masses EXTREMITIES:  Moves all extremities well, without clubbing, cyanosis, or edema NEUROLOGIC:  Alert and oriented x 3, normal gait, CN II-XII grossly intact MENTAL STATUS:  appropriate BACK:  Non-tender to palpation, No CVAT SKIN:  Warm, dry, and intact  Results: U/A:  negative

## 2022-10-06 ENCOUNTER — Ambulatory Visit (HOSPITAL_BASED_OUTPATIENT_CLINIC_OR_DEPARTMENT_OTHER)
Admission: RE | Admit: 2022-10-06 | Discharge: 2022-10-06 | Disposition: A | Discharge status: Home / Self Care | Payer: BC Managed Care – PPO | Source: Ambulatory Visit | Attending: Urology | Admitting: Urology

## 2022-10-06 DIAGNOSIS — N2889 Other specified disorders of kidney and ureter: Secondary | ICD-10-CM | POA: Diagnosis not present

## 2022-10-06 DIAGNOSIS — R9389 Abnormal findings on diagnostic imaging of other specified body structures: Secondary | ICD-10-CM

## 2022-10-06 MED ORDER — IOHEXOL 300 MG/ML  SOLN
125.0000 mL | Freq: Once | INTRAMUSCULAR | Status: AC | PRN
  Administered 2022-10-06: 125 mL via INTRAVENOUS

## 2022-10-12 ENCOUNTER — Encounter: Payer: Self-pay | Admitting: Urology

## 2022-10-26 ENCOUNTER — Encounter: Payer: Self-pay | Admitting: Urology

## 2022-11-03 ENCOUNTER — Ambulatory Visit: Payer: BC Managed Care – PPO | Admitting: Urology

## 2022-11-03 ENCOUNTER — Encounter: Payer: Self-pay | Admitting: Urology

## 2022-11-03 VITALS — BP 129/82 | HR 76 | Ht 63.0 in | Wt 141.0 lb

## 2022-11-03 DIAGNOSIS — N393 Stress incontinence (female) (male): Secondary | ICD-10-CM

## 2022-11-03 DIAGNOSIS — Z87440 Personal history of urinary (tract) infections: Secondary | ICD-10-CM

## 2022-11-03 DIAGNOSIS — N390 Urinary tract infection, site not specified: Secondary | ICD-10-CM | POA: Insufficient documentation

## 2022-11-03 LAB — URINALYSIS, ROUTINE W REFLEX MICROSCOPIC
Bilirubin, UA: NEGATIVE
Glucose, UA: NEGATIVE
Ketones, UA: NEGATIVE
Nitrite, UA: NEGATIVE
Protein,UA: NEGATIVE
Specific Gravity, UA: 1.025 (ref 1.005–1.030)
Urobilinogen, Ur: 0.2 mg/dL (ref 0.2–1.0)
pH, UA: 5.5 (ref 5.0–7.5)

## 2022-11-03 LAB — MICROSCOPIC EXAMINATION
Cast Type: NONE SEEN
Casts: NONE SEEN /lpf
Crystal Type: NONE SEEN
Crystals: NONE SEEN
Trichomonas, UA: NONE SEEN
Yeast, UA: NONE SEEN

## 2022-11-03 NOTE — Progress Notes (Signed)
Assessment: 1. Frequent UTI     Plan: I personally reviewed the CT study from 10/10/2022 with results as noted below. I discussed these results with the patient in detail today.  There is complete resolution of the previously noted abnormalities which are likely due to her UTI. Continue methods to reduce the risk of UTI's discussed including increased fluid intake, timed and double voiding, daily cranberry supplement, daily probiotic, and vaginal hormone cream. Return to office in 3 months  Chief Complaint:  Chief Complaint  Patient presents with   Frequent UTI    History of Present Illness:  Denise Gibson is a 62 y.o. female who is seen for further evaluation of UTI and abnormal CT findings. She reported a history of 4 UTIs within the past year.  These have been treated by her primary care provider.  No culture results available.  She typically has symptoms of frequency, urgency, dysuria.  No history of flank pain, fever, or chills.  Her symptoms typically resolve with antibiotic therapy. She presented to the emergency room on 08/08/2022 with right lower quadrant abdominal pain, and increased urinary frequency.  She was not having any dysuria, gross hematuria, flank pain, fever, or chills.   Urine culture grew >100 K E. coli.  She was treated with Bactrim. CT abdomen and pelvis with contrast showed small hypoattenuating lesions in both kidneys too small to characterize, mild fullness of the right intrarenal collecting system and right ureter associated with preferential mural enhancement in the right renal pelvis and right ureter to the level of the right UVJ which appeared thickened with increased enhancement.  Her symptoms resolved following antibiotic therapy.  She was no longer having any right lower quadrant pain. No dysuria or gross hematuria. No prior history of kidney stones.  No history of UTIs prior to the past year.   She reported occasional stress incontinence but does  not require any pad usage. At her visit in 4/24, she continued on daily Macrobid.  No UTI symptoms.  Her right lower abdominal pain had completely resolved.  No dysuria or gross hematuria.  She continued with occasional stress incontinence. CT imaging from 10/06/2022 showed resolution of the previously noted right pelvic fullness and ureteral mural thickening with UVJ enhancement, no renal or ureteral calculi filling defects or obstruction.  She returns today for follow-up.  She completed her daily Macrodantin approximately 1 week ago.  No current UTI symptoms.  She continues to have occasional stress incontinence.  No dysuria or gross hematuria.  No flank pain.  Portions of the above documentation were copied from a prior visit for review purposes only.  Past Medical History:  Past Medical History:  Diagnosis Date   Breast cyst 02/2011   rigth breast   Diarrhea    History of COVID-19 05/2019   Hyperlipidemia 2017   Hypertension    IBS (irritable bowel syndrome)    Plantar fasciitis    Rosacea    Tendinitis    Rt arm 2011, Lt arm 01/2012   Vitamin D deficiency 2016   Weight loss, unintentional     Past Surgical History:  Past Surgical History:  Procedure Laterality Date   BREAST CYST ASPIRATION Left 2011   5cc   BREAST EXCISIONAL BIOPSY Right    BREAST SURGERY Right 03/09/11   exc of right breast duct system    CHOLECYSTECTOMY  03/25/11   Laparoscopy   LAPAROSCOPIC CHOLECYSTECTOMY  2002   VAGINAL HYSTERECTOMY  1998   DUB  Allergies:  No Known Allergies  Family History:  Family History  Problem Relation Age of Onset   Hypertension Mother    COPD Mother    Fibromyalgia Mother    Hyperlipidemia Father    Breast cancer Maternal Grandmother     Social History:  Social History   Tobacco Use   Smoking status: Never   Smokeless tobacco: Never  Vaping Use   Vaping Use: Never used  Substance Use Topics   Alcohol use: No    Alcohol/week: 0.0 standard drinks of  alcohol   Drug use: No    ROS: Constitutional:  Negative for fever, chills, weight loss CV: Negative for chest pain, previous MI, hypertension Respiratory:  Negative for shortness of breath, wheezing, sleep apnea, frequent cough GI:  Negative for nausea, vomiting, bloody stool, GERD  Physical exam: BP 129/82   Pulse 76   Ht 5\' 3"  (1.6 m)   Wt 141 lb (64 kg)   BMI 24.98 kg/m  GENERAL APPEARANCE:  Well appearing, well developed, well nourished, NAD HEENT:  Atraumatic, normocephalic, oropharynx clear NECK:  Supple without lymphadenopathy or thyromegaly ABDOMEN:  Soft, non-tender, no masses EXTREMITIES:  Moves all extremities well, without clubbing, cyanosis, or edema NEUROLOGIC:  Alert and oriented x 3, normal gait, CN II-XII grossly intact MENTAL STATUS:  appropriate BACK:  Non-tender to palpation, No CVAT SKIN:  Warm, dry, and intact  Results: U/A: 0-5 WBC, 0-2 RBC, few bacteria

## 2022-11-19 DIAGNOSIS — K219 Gastro-esophageal reflux disease without esophagitis: Secondary | ICD-10-CM | POA: Diagnosis not present

## 2022-11-19 DIAGNOSIS — I10 Essential (primary) hypertension: Secondary | ICD-10-CM | POA: Diagnosis not present

## 2022-11-19 DIAGNOSIS — Z0000 Encounter for general adult medical examination without abnormal findings: Secondary | ICD-10-CM | POA: Diagnosis not present

## 2022-11-19 DIAGNOSIS — J301 Allergic rhinitis due to pollen: Secondary | ICD-10-CM | POA: Diagnosis not present

## 2022-11-19 DIAGNOSIS — E7800 Pure hypercholesterolemia, unspecified: Secondary | ICD-10-CM | POA: Diagnosis not present

## 2022-11-20 DIAGNOSIS — F4323 Adjustment disorder with mixed anxiety and depressed mood: Secondary | ICD-10-CM | POA: Diagnosis not present

## 2022-12-03 DIAGNOSIS — F4323 Adjustment disorder with mixed anxiety and depressed mood: Secondary | ICD-10-CM | POA: Diagnosis not present

## 2022-12-11 DIAGNOSIS — F4323 Adjustment disorder with mixed anxiety and depressed mood: Secondary | ICD-10-CM | POA: Diagnosis not present

## 2022-12-18 DIAGNOSIS — F4323 Adjustment disorder with mixed anxiety and depressed mood: Secondary | ICD-10-CM | POA: Diagnosis not present

## 2023-01-08 DIAGNOSIS — F4323 Adjustment disorder with mixed anxiety and depressed mood: Secondary | ICD-10-CM | POA: Diagnosis not present

## 2023-01-15 DIAGNOSIS — F4323 Adjustment disorder with mixed anxiety and depressed mood: Secondary | ICD-10-CM | POA: Diagnosis not present

## 2023-01-28 DIAGNOSIS — F4323 Adjustment disorder with mixed anxiety and depressed mood: Secondary | ICD-10-CM | POA: Diagnosis not present

## 2023-02-03 ENCOUNTER — Ambulatory Visit: Payer: BC Managed Care – PPO | Admitting: Urology

## 2023-02-03 ENCOUNTER — Encounter: Payer: Self-pay | Admitting: Urology

## 2023-02-03 VITALS — BP 130/84 | HR 73 | Ht 63.0 in | Wt 146.0 lb

## 2023-02-03 DIAGNOSIS — Z09 Encounter for follow-up examination after completed treatment for conditions other than malignant neoplasm: Secondary | ICD-10-CM

## 2023-02-03 DIAGNOSIS — Z87440 Personal history of urinary (tract) infections: Secondary | ICD-10-CM

## 2023-02-03 LAB — URINALYSIS, ROUTINE W REFLEX MICROSCOPIC
Bilirubin, UA: NEGATIVE
Glucose, UA: NEGATIVE
Ketones, UA: NEGATIVE
Leukocytes,UA: NEGATIVE
Nitrite, UA: NEGATIVE
Protein,UA: NEGATIVE
Specific Gravity, UA: >1.03 — ABNORMAL HIGH (ref 1.005–1.030)
Urobilinogen, Ur: 0.2 mg/dL (ref 0.2–1.0)
pH, UA: 5.5 (ref 5.0–7.5)

## 2023-02-03 LAB — MICROSCOPIC EXAMINATION: Epithelial Cells (non renal): >10 /HPF — AB (ref 0–10)

## 2023-02-03 NOTE — Progress Notes (Signed)
Assessment: 1. History of UTI     Plan: No evidence of UTI today. Continue methods to reduce the risk of UTI's discussed including increased fluid intake, timed and double voiding, daily cranberry supplement, daily probiotic. Return to office in 6 months  Chief Complaint:  Chief Complaint  Patient presents with   Frequent UTI    History of Present Illness:  Denise Gibson is a 62 y.o. female who is seen for further evaluation of frequent UTI's. She reported a history of 4 UTIs within the past year.  These have been treated by her primary care provider.  No culture results available.  She typically has symptoms of frequency, urgency, dysuria.  No history of flank pain, fever, or chills.  Her symptoms typically resolve with antibiotic therapy. She presented to the emergency room on 08/08/2022 with right lower quadrant abdominal pain, and increased urinary frequency.  She was not having any dysuria, gross hematuria, flank pain, fever, or chills.   Urine culture grew >100 K E. coli.  She was treated with Bactrim. CT abdomen and pelvis with contrast showed small hypoattenuating lesions in both kidneys too small to characterize, mild fullness of the right intrarenal collecting system and right ureter associated with preferential mural enhancement in the right renal pelvis and right ureter to the level of the right UVJ which appeared thickened with increased enhancement.  Her symptoms resolved following antibiotic therapy.  She was no longer having any right lower quadrant pain. No dysuria or gross hematuria. No prior history of kidney stones.  No history of UTIs prior to the past year.   She reported occasional stress incontinence but does not require any pad usage. At her visit in 4/24, she continued on daily Macrobid.  No UTI symptoms.  Her right lower abdominal pain had completely resolved.  No dysuria or gross hematuria.  She continued with occasional stress incontinence. CT imaging  from 10/06/2022 showed resolution of the previously noted right pelvic fullness and ureteral mural thickening with UVJ enhancement, no renal or ureteral calculi filling defects or obstruction.  At her visit in 6/24, she had completed her daily Macrodantin approximately 1 week prior. No current UTI symptoms.  She continued to have occasional stress incontinence.  No dysuria or gross hematuria.  No flank pain.  She returns today for scheduled follow-up.  She has not had any UTI symptoms since her last visit.  No dysuria or gross hematuria.  She continues to have occasional urgency and some stress incontinence.  Portions of the above documentation were copied from a prior visit for review purposes only.  Past Medical History:  Past Medical History:  Diagnosis Date   Breast cyst 02/2011   rigth breast   Diarrhea    History of COVID-19 05/2019   Hyperlipidemia 2017   Hypertension    IBS (irritable bowel syndrome)    Plantar fasciitis    Rosacea    Tendinitis    Rt arm 2011, Lt arm 01/2012   Vitamin D deficiency 2016   Weight loss, unintentional     Past Surgical History:  Past Surgical History:  Procedure Laterality Date   BREAST CYST ASPIRATION Left 2011   5cc   BREAST EXCISIONAL BIOPSY Right    BREAST SURGERY Right 03/09/11   exc of right breast duct system    CHOLECYSTECTOMY  03/25/11   Laparoscopy   LAPAROSCOPIC CHOLECYSTECTOMY  2002   VAGINAL HYSTERECTOMY  1998   DUB    Allergies:  No Known Allergies  Family History:  Family History  Problem Relation Age of Onset   Hypertension Mother    COPD Mother    Fibromyalgia Mother    Hyperlipidemia Father    Breast cancer Maternal Grandmother     Social History:  Social History   Tobacco Use   Smoking status: Never   Smokeless tobacco: Never  Vaping Use   Vaping status: Never Used  Substance Use Topics   Alcohol use: No    Alcohol/week: 0.0 standard drinks of alcohol   Drug use: No    ROS: Constitutional:   Negative for fever, chills, weight loss CV: Negative for chest pain, previous MI, hypertension Respiratory:  Negative for shortness of breath, wheezing, sleep apnea, frequent cough GI:  Negative for nausea, vomiting, bloody stool, GERD  Physical exam: BP 130/84   Pulse 73   Ht 5\' 3"  (1.6 m)   Wt 146 lb (66.2 kg)   BMI 25.86 kg/m  GENERAL APPEARANCE:  Well appearing, well developed, well nourished, NAD HEENT:  Atraumatic, normocephalic, oropharynx clear NECK:  Supple without lymphadenopathy or thyromegaly ABDOMEN:  Soft, non-tender, no masses EXTREMITIES:  Moves all extremities well, without clubbing, cyanosis, or edema NEUROLOGIC:  Alert and oriented x 3, normal gait, CN II-XII grossly intact MENTAL STATUS:  appropriate BACK:  Non-tender to palpation, No CVAT SKIN:  Warm, dry, and intact  Results: U/A: 0-5 WBC, 0-2 RBC, few bacteria

## 2023-02-11 DIAGNOSIS — F4323 Adjustment disorder with mixed anxiety and depressed mood: Secondary | ICD-10-CM | POA: Diagnosis not present

## 2023-02-26 DIAGNOSIS — F4323 Adjustment disorder with mixed anxiety and depressed mood: Secondary | ICD-10-CM | POA: Diagnosis not present

## 2023-03-12 DIAGNOSIS — F4323 Adjustment disorder with mixed anxiety and depressed mood: Secondary | ICD-10-CM | POA: Diagnosis not present

## 2023-04-01 NOTE — Progress Notes (Signed)
62 y.o. G18P1001 Married Caucasian female here for annual exam.    Still with hot flashes.  Black cohosh helps somewhat.   She may add evening primrose.  No concerns with sexual functioning.   Taking a 5 day course of Prednisone for allergies.   Some trouble falling asleep and more tired at this time of the year.  Spends more time inside.  Likes to read and do crochet. Denies depression or suicidal ideation.   PCP: Merri Brunette, MD   No LMP recorded. Patient has had a hysterectomy.           Sexually active: Yes.    The current method of family planning is status post hysterectomy.    Menopausal hormone therapy:  n/a Exercising: Yes.     walking Smoker:  no  OB History  Gravida Para Term Preterm AB Living  1 1 1     1   SAB IAB Ectopic Multiple Live Births          1    # Outcome Date GA Lbr Len/2nd Weight Sex Type Anes PTL Lv  1 Term 04/1987 [redacted]w[redacted]d  8 lb (3.629 kg) F Vag-Spont   LIV     HEALTH MAINTENANCE: Pap:  2005 normal. History of abnormal Pap or positive HPV:  no Mammogram: 03/25/22 Breast Density Cat C, BI-RADS CAT 1 neg Colonoscopy:  Dr. Randa Evens at [facility] 07/2014 benign polyps;next due 07/2024   Bone Density:  n/a  Result  n/a   Immunization History  Administered Date(s) Administered   Tdap 06/15/2008, 01/20/2019  Declines flu vaccine.     reports that she has never smoked. She has never used smokeless tobacco. She reports that she does not drink alcohol and does not use drugs.  Past Medical History:  Diagnosis Date   Breast cyst 02/2011   rigth breast   Diarrhea    History of COVID-19 05/2019   Hyperlipidemia 2017   Hypertension    IBS (irritable bowel syndrome)    Plantar fasciitis    Rosacea    Tendinitis    Rt arm 2011, Lt arm 01/2012   Vitamin D deficiency 2016   Weight loss, unintentional     Past Surgical History:  Procedure Laterality Date   BREAST CYST ASPIRATION Left 2011   5cc   BREAST EXCISIONAL BIOPSY Right    BREAST SURGERY  Right 03/09/11   exc of right breast duct system    CHOLECYSTECTOMY  03/25/11   Laparoscopy   LAPAROSCOPIC CHOLECYSTECTOMY  2002   VAGINAL HYSTERECTOMY  1998   DUB    Current Outpatient Medications  Medication Sig Dispense Refill   Acetaminophen (TYLENOL PO) Take by mouth as needed.     Black Cohosh 20 MG TABS Take 1 tablet by mouth 2 (two) times daily.     Calcium Carbonate-Vitamin D (CALCIUM 600+D PO) Take by mouth daily. Has 800mg  of vitamin D3     Cholecalciferol (VITAMIN D3) 2000 units TABS Take 1 tablet by mouth daily.     famotidine (PEPCID) 20 MG tablet Take 20 mg by mouth 2 (two) times daily.     Fexofenadine HCl (ALLEGRA PO) Take by mouth as needed.       lisinopril (ZESTRIL) 10 MG tablet 1 tablet     Polyethyl Glycol-Propyl Glycol (SYSTANE) 0.4-0.3 % SOLN 1 drop into affected eye as needed     predniSONE (DELTASONE) 5 MG tablet Take 5 mg by mouth daily with breakfast.     No current  facility-administered medications for this visit.    ALLERGIES: Patient has no known allergies.  Family History  Problem Relation Age of Onset   Hypertension Mother    COPD Mother    Fibromyalgia Mother    Hyperlipidemia Father    Breast cancer Maternal Grandmother     Review of Systems  All other systems reviewed and are negative.   PHYSICAL EXAM:  BP 124/82 (BP Location: Left Arm, Patient Position: Sitting, Cuff Size: Normal)   Pulse 75   Ht 5\' 4"  (1.626 m)   Wt 144 lb (65.3 kg)   SpO2 98%   BMI 24.72 kg/m     General appearance: alert, cooperative and appears stated age Head: normocephalic, without obvious abnormality, atraumatic Neck: no adenopathy, supple, symmetrical, trachea midline and thyroid normal to inspection and palpation Lungs: clear to auscultation bilaterally Breasts: normal appearance, no masses or tenderness, No nipple retraction or dimpling, No nipple discharge or bleeding, No axillary adenopathy Heart: regular rate and rhythm Abdomen: soft, non-tender;  no masses, no organomegaly Extremities: extremities normal, atraumatic, no cyanosis or edema Skin: skin color, texture, turgor normal. No rashes or lesions Lymph nodes: cervical, supraclavicular, and axillary nodes normal. Neurologic: grossly normal  Pelvic: External genitalia:  no lesions              No abnormal inguinal nodes palpated.              Urethra:  normal appearing urethra with no masses, tenderness or lesions              Bartholins and Skenes: normal                 Vagina: normal appearing vagina with normal color and discharge, no lesions              Cervix: absent              Pap taken: No. Bimanual Exam:  Uterus:  absent              Adnexa: no mass, fullness, tenderness              Rectal exam: Yes.  .  Confirms.              Anus:  normal sphincter tone, no lesions  Chaperone was present for exam:  Warren Lacy, CMA  ASSESSMENT: Well woman visit with gynecologic exam Status post total vaginal hysterectomy.  Ovaries remain.  Menopausal symptoms. Taking [employer].  Side effects from Effexor and Gabapentin, so stopped. Hyperlipidemia.  HTN.  PLAN: Mammogram screening discussed.  She will schedule.  Self breast awareness reviewed. Pap and HRV collected:  No.  Not indicated. Guidelines for Calcium, Vitamin D, regular exercise program including cardiovascular and weight bearing exercise. Medication refills:  None. Labs with PCP. BMD around age 71 yo. Follow up:  1 year and prn.

## 2023-04-15 ENCOUNTER — Encounter: Payer: Self-pay | Admitting: Obstetrics and Gynecology

## 2023-04-15 ENCOUNTER — Ambulatory Visit (INDEPENDENT_AMBULATORY_CARE_PROVIDER_SITE_OTHER): Payer: BC Managed Care – PPO | Admitting: Obstetrics and Gynecology

## 2023-04-15 VITALS — BP 124/82 | HR 75 | Ht 64.0 in | Wt 144.0 lb

## 2023-04-15 DIAGNOSIS — Z01419 Encounter for gynecological examination (general) (routine) without abnormal findings: Secondary | ICD-10-CM

## 2023-04-15 NOTE — Patient Instructions (Signed)

## 2023-04-26 ENCOUNTER — Other Ambulatory Visit: Payer: Self-pay | Admitting: Obstetrics and Gynecology

## 2023-04-26 DIAGNOSIS — Z1231 Encounter for screening mammogram for malignant neoplasm of breast: Secondary | ICD-10-CM

## 2023-05-11 ENCOUNTER — Ambulatory Visit: Payer: BC Managed Care – PPO

## 2023-05-12 ENCOUNTER — Ambulatory Visit
Admission: RE | Admit: 2023-05-12 | Discharge: 2023-05-12 | Disposition: A | Discharge status: Home / Self Care | Payer: BC Managed Care – PPO | Source: Ambulatory Visit | Attending: Obstetrics and Gynecology | Admitting: Obstetrics and Gynecology

## 2023-05-12 DIAGNOSIS — Z1231 Encounter for screening mammogram for malignant neoplasm of breast: Secondary | ICD-10-CM

## 2023-07-21 DIAGNOSIS — J309 Allergic rhinitis, unspecified: Secondary | ICD-10-CM | POA: Diagnosis not present

## 2023-08-04 ENCOUNTER — Ambulatory Visit: Payer: BC Managed Care – PPO | Admitting: Urology

## 2023-08-04 ENCOUNTER — Encounter: Payer: Self-pay | Admitting: Urology

## 2023-08-04 VITALS — BP 135/80 | HR 69 | Ht 63.0 in | Wt 144.0 lb

## 2023-08-04 DIAGNOSIS — Z87440 Personal history of urinary (tract) infections: Secondary | ICD-10-CM | POA: Diagnosis not present

## 2023-08-04 DIAGNOSIS — Z09 Encounter for follow-up examination after completed treatment for conditions other than malignant neoplasm: Secondary | ICD-10-CM

## 2023-08-04 LAB — URINALYSIS, ROUTINE W REFLEX MICROSCOPIC
Bilirubin, UA: NEGATIVE
Glucose, UA: NEGATIVE
Ketones, UA: NEGATIVE
Nitrite, UA: NEGATIVE
Protein,UA: NEGATIVE
RBC, UA: NEGATIVE
Specific Gravity, UA: 1.025 (ref 1.005–1.030)
Urobilinogen, Ur: 0.2 mg/dL (ref 0.2–1.0)
pH, UA: 5 (ref 5.0–7.5)

## 2023-08-04 LAB — MICROSCOPIC EXAMINATION

## 2023-08-04 NOTE — Progress Notes (Signed)
 Assessment: 1. History of UTI     Plan: No UTI's x 1 year. Continue methods to reduce the risk of UTI's discussed including increased fluid intake, timed and double voiding, daily cranberry supplement, daily probiotic. Return to office as needed  Chief Complaint:  Chief Complaint  Patient presents with   History of UTI     History of Present Illness:  GAE BIHL is a 63 y.o. female who is seen for further evaluation of frequent UTI's. She reported a history of 4 UTIs within the past year.  These have been treated by her primary care provider.  No culture results available.  She typically has symptoms of frequency, urgency, dysuria.  No history of flank pain, fever, or chills.  Her symptoms typically resolve with antibiotic therapy. She presented to the emergency room on 08/08/2022 with right lower quadrant abdominal pain, and increased urinary frequency.  She was not having any dysuria, gross hematuria, flank pain, fever, or chills.   Urine culture grew >100 K E. coli.  She was treated with Bactrim. CT abdomen and pelvis with contrast showed small hypoattenuating lesions in both kidneys too small to characterize, mild fullness of the right intrarenal collecting system and right ureter associated with preferential mural enhancement in the right renal pelvis and right ureter to the level of the right UVJ which appeared thickened with increased enhancement.  Her symptoms resolved following antibiotic therapy.  She was no longer having any right lower quadrant pain. No dysuria or gross hematuria. No prior history of kidney stones.  No history of UTIs prior to the past year.   She reported occasional stress incontinence but does not require any pad usage. At her visit in 4/24, she continued on daily Macrobid.  No UTI symptoms.  Her right lower abdominal pain had completely resolved.  No dysuria or gross hematuria.  She continued with occasional stress incontinence. CT imaging from  10/06/2022 showed resolution of the previously noted right pelvic fullness and ureteral mural thickening with UVJ enhancement, no renal or ureteral calculi filling defects or obstruction.  At her visit in 6/24, she had completed her daily Macrodantin approximately 1 week prior. No current UTI symptoms.  She continued to have occasional stress incontinence.  No dysuria or gross hematuria.  No flank pain. She was doing well at her visit in 9/24.  She returns today for 19-month follow-up.  She is not had any UTI symptoms since her visit in September 2024.  No dysuria or gross hematuria.  She continues with mild stress incontinence which is not bothersome for her.  Portions of the above documentation were copied from a prior visit for review purposes only.  Past Medical History:  Past Medical History:  Diagnosis Date   Breast cyst 02/2011   rigth breast   Diarrhea    History of COVID-19 05/2019   Hyperlipidemia 2017   Hypertension    IBS (irritable bowel syndrome)    Plantar fasciitis    Rosacea    Tendinitis    Rt arm 2011, Lt arm 01/2012   Vitamin D deficiency 2016   Weight loss, unintentional     Past Surgical History:  Past Surgical History:  Procedure Laterality Date   BREAST CYST ASPIRATION Left 2011   5cc   BREAST EXCISIONAL BIOPSY Right    BREAST SURGERY Right 03/09/11   exc of right breast duct system    CHOLECYSTECTOMY  03/25/11   Laparoscopy   LAPAROSCOPIC CHOLECYSTECTOMY  2002   VAGINAL HYSTERECTOMY  1998   DUB    Allergies:  No Known Allergies  Family History:  Family History  Problem Relation Age of Onset   Hypertension Mother    COPD Mother    Fibromyalgia Mother    Hyperlipidemia Father    Breast cancer Maternal Grandmother     Social History:  Social History   Tobacco Use   Smoking status: Never   Smokeless tobacco: Never  Vaping Use   Vaping status: Never Used  Substance Use Topics   Alcohol use: No    Alcohol/week: 0.0 standard drinks of  alcohol   Drug use: No    ROS: Constitutional:  Negative for fever, chills, weight loss CV: Negative for chest pain, previous MI, hypertension Respiratory:  Negative for shortness of breath, wheezing, sleep apnea, frequent cough GI:  Negative for nausea, vomiting, bloody stool, GERD  Physical exam: BP 135/80   Pulse 69   Ht 5\' 3"  (1.6 m)   Wt 144 lb (65.3 kg)   BMI 25.51 kg/m  GENERAL APPEARANCE:  Well appearing, well developed, well nourished, NAD HEENT:  Atraumatic, normocephalic, oropharynx clear NECK:  Supple without lymphadenopathy or thyromegaly ABDOMEN:  Soft, non-tender, no masses EXTREMITIES:  Moves all extremities well, without clubbing, cyanosis, or edema NEUROLOGIC:  Alert and oriented x 3, normal gait, CN II-XII grossly intact MENTAL STATUS:  appropriate BACK:  Non-tender to palpation, No CVAT SKIN:  Warm, dry, and intact  Results: U/A: 0-5 WBC, 0-2 RBC, few bacteria

## 2023-09-27 DIAGNOSIS — J029 Acute pharyngitis, unspecified: Secondary | ICD-10-CM | POA: Diagnosis not present

## 2023-10-13 DIAGNOSIS — J020 Streptococcal pharyngitis: Secondary | ICD-10-CM | POA: Diagnosis not present

## 2023-12-01 DIAGNOSIS — I10 Essential (primary) hypertension: Secondary | ICD-10-CM | POA: Diagnosis not present

## 2023-12-01 DIAGNOSIS — J309 Allergic rhinitis, unspecified: Secondary | ICD-10-CM | POA: Diagnosis not present

## 2023-12-01 DIAGNOSIS — E7800 Pure hypercholesterolemia, unspecified: Secondary | ICD-10-CM | POA: Diagnosis not present

## 2023-12-01 DIAGNOSIS — K219 Gastro-esophageal reflux disease without esophagitis: Secondary | ICD-10-CM | POA: Diagnosis not present

## 2023-12-01 DIAGNOSIS — Z0000 Encounter for general adult medical examination without abnormal findings: Secondary | ICD-10-CM | POA: Diagnosis not present

## 2024-02-25 DIAGNOSIS — Z1211 Encounter for screening for malignant neoplasm of colon: Secondary | ICD-10-CM | POA: Diagnosis not present

## 2024-02-25 DIAGNOSIS — K648 Other hemorrhoids: Secondary | ICD-10-CM | POA: Diagnosis not present

## 2024-02-25 DIAGNOSIS — K5730 Diverticulosis of large intestine without perforation or abscess without bleeding: Secondary | ICD-10-CM | POA: Diagnosis not present

## 2024-04-10 ENCOUNTER — Other Ambulatory Visit: Payer: Self-pay | Admitting: Obstetrics and Gynecology

## 2024-04-10 DIAGNOSIS — Z1231 Encounter for screening mammogram for malignant neoplasm of breast: Secondary | ICD-10-CM

## 2024-04-17 NOTE — Progress Notes (Unsigned)
 63 y.o. G98P1001 Married Caucasian female here for annual exam.    PCP: Claudene Pellet, MD   No LMP recorded. Patient has had a hysterectomy.           Sexually active: Yes.    The current method of family planning is status post hysterectomy.    Menopausal hormone therapy:  n/a Exercising: {yes no:314532}  {types:19826} Smoker:  no  OB History  Gravida Para Term Preterm AB Living  1 1 1   1   SAB IAB Ectopic Multiple Live Births      1    # Outcome Date GA Lbr Len/2nd Weight Sex Type Anes PTL Lv  1 Term 04/1987 [redacted]w[redacted]d  8 lb (3.629 kg) F Vag-Spont   LIV     HEALTH MAINTENANCE: Last 2 paps:  2005 normal  History of abnormal Pap or positive HPV:  no Mammogram:   05/12/23 Breast Density cat C, BIRADS Cat 1 neg  Colonoscopy:   [facility]. Celestia at [facility] 07/2014 benign polyps;next due 07/2024   Bone Density:  n/a  Result  n/a   Immunization History  Administered Date(s) Administered   Tdap 06/15/2008, 01/20/2019      reports that she has never smoked. She has never used smokeless tobacco. She reports that she does not drink alcohol and does not use drugs.  Past Medical History:  Diagnosis Date   Breast cyst 02/2011   rigth breast   Diarrhea    History of COVID-19 05/2019   Hyperlipidemia 2017   Hypertension    IBS (irritable bowel syndrome)    Plantar fasciitis    Rosacea    Tendinitis    Rt arm 2011, Lt arm 01/2012   Vitamin D  deficiency 2016   Weight loss, unintentional     Past Surgical History:  Procedure Laterality Date   BREAST CYST ASPIRATION Left 2011   5cc   BREAST EXCISIONAL BIOPSY Right    BREAST SURGERY Right 03/09/11   exc of right breast duct system    CHOLECYSTECTOMY  03/25/11   Laparoscopy   LAPAROSCOPIC CHOLECYSTECTOMY  2002   VAGINAL HYSTERECTOMY  1998   DUB    Current Outpatient Medications  Medication Sig Dispense Refill   Acetaminophen  (TYLENOL  PO) Take by mouth as needed.     Black Cohosh 20 MG TABS Take 1 tablet by mouth 2 (two) times  daily.     Calcium Carbonate-Vitamin D  (CALCIUM 600+D PO) Take by mouth daily. Has 800mg  of vitamin D3     Cholecalciferol (VITAMIN D3) 2000 units TABS Take 1 tablet by mouth daily.     famotidine (PEPCID) 20 MG tablet Take 20 mg by mouth 2 (two) times daily.     Fexofenadine HCl (ALLEGRA PO) Take by mouth as needed.       lisinopril (ZESTRIL) 10 MG tablet 1 tablet     Polyethyl Glycol-Propyl Glycol (SYSTANE) 0.4-0.3 % SOLN 1 drop into affected eye as needed     predniSONE (DELTASONE) 5 MG tablet Take 5 mg by mouth daily with breakfast. (Patient not taking: Reported on 08/04/2023)     No current facility-administered medications for this visit.    ALLERGIES: Patient has no known allergies.  Family History  Problem Relation Age of Onset   Hypertension Mother    COPD Mother    Fibromyalgia Mother    Hyperlipidemia Father    Breast cancer Maternal Grandmother     Review of Systems  PHYSICAL EXAM:  There were no vitals taken  for this visit.    General appearance: alert, cooperative and appears stated age Head: normocephalic, without obvious abnormality, atraumatic Neck: no adenopathy, supple, symmetrical, trachea midline and thyroid  normal to inspection and palpation Lungs: clear to auscultation bilaterally Breasts: normal appearance, no masses or tenderness, No nipple retraction or dimpling, No nipple discharge or bleeding, No axillary adenopathy Heart: regular rate and rhythm Abdomen: soft, non-tender; no masses, no organomegaly Extremities: extremities normal, atraumatic, no cyanosis or edema Skin: skin color, texture, turgor normal. No rashes or lesions Lymph nodes: cervical, supraclavicular, and axillary nodes normal. Neurologic: grossly normal  Pelvic: External genitalia:  no lesions              No abnormal inguinal nodes palpated.              Urethra:  normal appearing urethra with no masses, tenderness or lesions              Bartholins and Skenes: normal                  Vagina: normal appearing vagina with normal color and discharge, no lesions              Cervix: no lesions              Pap taken: {yes no:314532} Bimanual Exam:  Uterus:  normal size, contour, position, consistency, mobility, non-tender              Adnexa: no mass, fullness, tenderness              Rectal exam: {yes no:314532}.  Confirms.              Anus:  normal sphincter tone, no lesions  Chaperone was present for exam:  {BSCHAPERONE:31226::Emily F, CMA}  ASSESSMENT: Well woman visit with gynecologic exam.  PHQ-2-9: ***  ***  PLAN: Mammogram screening discussed. Self breast awareness reviewed. Pap and HRV collected:  {yes no:314532} Guidelines for Calcium, Vitamin D , regular exercise program including cardiovascular and weight bearing exercise. Medication refills:  *** {LABS (Optional):23779} Follow up:  ***    Additional counseling given.  {yes [id number]. ***  total time was spent for this patient encounter, including preparation, face-to-face counseling with the patient, coordination of care, and documentation of the encounter in addition to doing the well woman visit with gynecologic exam.

## 2024-04-18 ENCOUNTER — Ambulatory Visit (INDEPENDENT_AMBULATORY_CARE_PROVIDER_SITE_OTHER): Payer: BC Managed Care – PPO | Admitting: Obstetrics and Gynecology

## 2024-04-18 ENCOUNTER — Encounter: Payer: Self-pay | Admitting: Obstetrics and Gynecology

## 2024-04-18 VITALS — BP 122/84 | HR 78 | Ht 64.75 in | Wt 144.0 lb

## 2024-04-18 DIAGNOSIS — Z01419 Encounter for gynecological examination (general) (routine) without abnormal findings: Secondary | ICD-10-CM

## 2024-04-18 DIAGNOSIS — Z1331 Encounter for screening for depression: Secondary | ICD-10-CM

## 2024-04-18 NOTE — Patient Instructions (Addendum)
 Insomnia Insomnia is a sleep disorder that makes it difficult to fall asleep or stay asleep. Insomnia can cause fatigue, low energy, difficulty concentrating, mood swings, and poor performance at work or school. There are three different ways to classify insomnia: Difficulty falling asleep. Difficulty staying asleep. Waking up too early in the morning. Any type of insomnia can be long-term (chronic) or short-term (acute). Both are common. Short-term insomnia usually lasts for 3 months or less. Chronic insomnia occurs at least three times a week for longer than 3 months. What are the causes? Insomnia may be caused by another condition, situation, or substance, such as: Having certain mental health conditions, such as anxiety and depression. Using caffeine, alcohol, tobacco, or drugs. Having gastrointestinal conditions, such as gastroesophageal reflux disease (GERD). Having certain medical conditions. These include: Asthma. Alzheimer's disease. Stroke. Chronic pain. An overactive thyroid  gland (hyperthyroidism). Other sleep disorders, such as restless legs syndrome and sleep apnea. Menopause. Sometimes, the cause of insomnia may not be known. What increases the risk? Risk factors for insomnia include: Gender. Females are affected more often than males. Age. Insomnia is more common as people get older. Stress and certain medical and mental health conditions. Lack of exercise. Having an irregular work schedule. This may include working night shifts and traveling between different time zones. What are the signs or symptoms? If you have insomnia, the main symptom is having trouble falling asleep or having trouble staying asleep. This may lead to other symptoms, such as: Feeling tired or having low energy. Feeling nervous about going to sleep. Not feeling rested in the morning. Having trouble concentrating. Feeling irritable, anxious, or depressed. How is this diagnosed? This condition  may be diagnosed based on: Your symptoms and medical history. Your health care provider may ask about: Your sleep habits. Any medical conditions you have. Your mental health. A physical exam. How is this treated? Treatment for insomnia depends on the cause. Treatment may focus on treating an underlying condition that is causing the insomnia. Treatment may also include: Medicines to help you sleep. Counseling or therapy. Lifestyle adjustments to help you sleep better. Follow these instructions at home: Eating and drinking  Limit or avoid alcohol, caffeinated beverages, and products that contain nicotine and tobacco, especially close to bedtime. These can disrupt your sleep. Do not eat a large meal or eat spicy foods right before bedtime. This can lead to digestive discomfort that can make it hard for you to sleep. Sleep habits  Keep a sleep diary to help you and your health care provider figure out what could be causing your insomnia. Write down: When you sleep. When you wake up during the night. How well you sleep and how rested you feel the next day. Any side effects of medicines you are taking. What you eat and drink. Make your bedroom a dark, comfortable place where it is easy to fall asleep. Put up shades or blackout curtains to block light from outside. Use a white noise machine to block noise. Keep the temperature cool. Limit screen use before bedtime. This includes: Not watching TV. Not using your smartphone, tablet, or computer. Stick to a routine that includes going to bed and waking up at the same times every day and night. This can help you fall asleep faster. Consider making a quiet activity, such as reading, part of your nighttime routine. Try to avoid taking naps during the day so that you sleep better at night. Get out of bed if you are still awake after  15 minutes of trying to sleep. Keep the lights down, but try reading or doing a quiet activity. When you feel  sleepy, go back to bed. General instructions Take over-the-counter and prescription medicines only as told by your health care provider. Exercise regularly as told by your health care provider. However, avoid exercising in the hours right before bedtime. Use relaxation techniques to manage stress. Ask your health care provider to suggest some techniques that may work well for you. These may include: Breathing exercises. Routines to release muscle tension. Visualizing peaceful scenes. Make sure that you drive carefully. Do not drive if you feel very sleepy. Keep all follow-up visits. This is important. Contact a health care provider if: You are tired throughout the day. You have trouble in your daily routine due to sleepiness. You continue to have sleep problems, or your sleep problems get worse. Get help right away if: You have thoughts about hurting yourself or someone else. Get help right away if you feel like you may hurt yourself or others, or have thoughts about taking your own life. Go to your nearest emergency room or: Call 911. Call the National Suicide Prevention Lifeline at 910-190-3486 or 988. This is open 24 hours a day. Text the Crisis Text Line at (860)408-5428. Summary Insomnia is a sleep disorder that makes it difficult to fall asleep or stay asleep. Insomnia can be long-term (chronic) or short-term (acute). Treatment for insomnia depends on the cause. Treatment may focus on treating an underlying condition that is causing the insomnia. Keep a sleep diary to help you and your health care provider figure out what could be causing your insomnia. This information is not intended to replace advice given to you by your health care provider. Make sure you discuss any questions you have with your health care provider. Document Revised: 04/21/2021 Document Reviewed: 04/21/2021 Elsevier Patient Education  2024 [employer]  Fezolinetant Tablets What is this medication? FEZOLINETANT  (FEZ oh LIN e tant) reduces the number and severity of hot flashes due to menopause. It works by blocking substances in your body that cause hot flashes and night sweats. This medicine may be used for other purposes; ask your health care provider or pharmacist if you have questions. COMMON BRAND NAME(S): VEOZAH What should I tell my care team before I take this medication? They need to know if you have any of these conditions: Kidney disease Liver disease An unusual or allergic reaction to fezolinetant, other medications, foods, dyes, or preservatives Pregnant or trying to get pregnant Breastfeeding How should I use this medication? Take this medication by mouth with water. Take it as directed on the prescription label at the same time every day. Do not cut, crush, or chew this medication. Swallow the tablets whole. You can take it with or without food. If it upsets your stomach, take it with food. Keep taking it unless your care team tells you to stop. Talk to your care team about the use of this medication in children. Special care may be needed. Overdosage: If you think you have taken too much of this medicine contact a poison control center or emergency room at once. NOTE: This medicine is only for you. Do not share this medicine with others. What if I miss a dose? If you miss a dose, take it as soon as you can unless it is more than 12 hours late. If it is more than 12 hours late, skip the missed dose. Take the next dose at the normal  time. What may interact with this medication? Other medications may affect the way this medication works. Talk with your care team about all of the medications you take. They may suggest changes to your treatment plan to lower the risk of side effects and to make sure your medications work as intended. This list may not describe all possible interactions. Give your health care provider a list of all the medicines, herbs, non-prescription drugs, or dietary  supplements you use. Also tell them if you smoke, drink alcohol, or use illegal drugs. Some items may interact with your medicine. What should I watch for while using this medication? Visit your care team for regular checks on your progress. Tell your care team if your symptoms do not start to get better or if they get worse. You may need blood work while taking this medication. What side effects may I notice from receiving this medication? Side effects that you should report to your care team as soon as possible: Allergic reactions--skin rash, itching, hives, swelling of the face, lips, tongue, or throat Liver injury--right upper belly pain, loss of appetite, nausea, light-colored stool, dark yellow or brown urine, yellowing skin or eyes, unusual weakness or fatigue Side effects that usually do not require medical attention (report these to your care team if they continue or are bothersome): Back pain Diarrhea Hot flashes Stomach pain Trouble sleeping This list may not describe all possible side effects. Call your doctor for medical advice about side effects. You may report side effects to FDA at 1-800-FDA-1088. Where should I keep my medication? Keep out of the reach of children and pets. Store at room temperature between 20 and 25 degrees C (68 and 77 degrees F). Get rid of any unused medication after the expiration date. To get rid of medications that are no longer needed or have expired: Take the medication to a medication take-back program. Check with your pharmacy or law enforcement to find a location. If you cannot return the medication, check the label or package insert to see if the medication should be thrown out in the garbage or flushed down the toilet. If you are not sure, ask your care team. If it is safe to put it in the trash, take the medication out of the container. Mix the medication with cat litter, dirt, coffee grounds, or other unwanted substance. Seal the mixture in a bag  or container. Put it in the trash. NOTE: This sheet is a summary. It may not cover all possible information. If you have questions about this medicine, talk to your doctor, pharmacist, or health care provider.  2024 Elsevier/Gold Standard (2021-10-16 00:00:00)  EXERCISE AND DIET:  We recommended that you start or continue a regular exercise program for good health. Regular exercise means any activity that makes your heart beat faster and makes you sweat.  We recommend exercising at least 30 minutes per day at least 3 days a week, preferably 4 or 5.  We also recommend a diet low in fat and sugar.  Inactivity, poor dietary choices and obesity can cause diabetes, heart attack, stroke, and kidney damage, among others.    ALCOHOL AND SMOKING:  Women should limit their alcohol intake to no more than 7 drinks/beers/glasses of wine (combined, not each!) per week. Moderation of alcohol intake to this level decreases your risk of breast cancer and liver damage. And of course, no recreational drugs are part of a healthy lifestyle.  And absolutely no smoking or even second hand smoke. Most  people know smoking can cause heart and lung diseases, but did you know it also contributes to weakening of your bones? Aging of your skin?  Yellowing of your teeth and nails?  CALCIUM AND VITAMIN D :  Adequate intake of calcium and Vitamin D  are recommended.  The recommendations for exact amounts of these supplements seem to change often, but generally speaking 600 mg of calcium (either carbonate or citrate) and 800 units of Vitamin D  per day seems prudent. Certain women may benefit from higher intake of Vitamin D .  If you are among these women, your doctor will have told you during your visit.    PAP SMEARS:  Pap smears, to check for cervical cancer or precancers,  have traditionally been done yearly, although recent scientific advances have shown that most women can have pap smears less often.  However, every woman still should  have a physical exam from her gynecologist every year. It will include a breast check, inspection of the vulva and vagina to check for abnormal growths or skin changes, a visual exam of the cervix, and then an exam to evaluate the size and shape of the uterus and ovaries.  And after 63 years of age, a rectal exam is indicated to check for rectal cancers. We will also provide age appropriate advice regarding health maintenance, like when you should have certain vaccines, screening for sexually transmitted diseases, bone density testing, colonoscopy, mammograms, etc.   MAMMOGRAMS:  All women over 31 years old should have a yearly mammogram. Many facilities now offer a 3D mammogram, which may cost around $50 extra out of pocket. If possible,  we recommend you accept the option to have the 3D mammogram performed.  It both reduces the number of women who will be called back for extra views which then turn out to be normal, and it is better than the routine mammogram at detecting truly abnormal areas.    COLONOSCOPY:  Colonoscopy to screen for colon cancer is recommended for all women at age 72.  We know, you hate the idea of the prep.  We agree, BUT, having colon cancer and not knowing it is worse!!  Colon cancer so often starts as a polyp that can be seen and removed at colonscopy, which can quite literally save your life!  And if your first colonoscopy is normal and you have no family history of colon cancer, most women don't have to have it again for 10 years.  Once every ten years, you can do something that may end up saving your life, right?  We will be happy to help you get it scheduled when you are ready.  Be sure to check your insurance coverage so you understand how much it will cost.  It may be covered as a preventative service at no cost, but you should check your particular policy.

## 2024-05-12 ENCOUNTER — Inpatient Hospital Stay
Admission: RE | Admit: 2024-05-12 | Discharge: 2024-05-12 | Attending: Obstetrics and Gynecology | Admitting: Obstetrics and Gynecology

## 2024-05-12 DIAGNOSIS — Z1231 Encounter for screening mammogram for malignant neoplasm of breast: Secondary | ICD-10-CM | POA: Diagnosis not present

## 2024-05-16 ENCOUNTER — Ambulatory Visit: Payer: Self-pay | Admitting: Obstetrics and Gynecology

## 2024-05-19 ENCOUNTER — Other Ambulatory Visit: Payer: Self-pay | Admitting: Obstetrics and Gynecology

## 2024-05-19 DIAGNOSIS — R928 Other abnormal and inconclusive findings on diagnostic imaging of breast: Secondary | ICD-10-CM

## 2024-06-15 ENCOUNTER — Other Ambulatory Visit

## 2024-06-15 ENCOUNTER — Encounter

## 2024-06-16 ENCOUNTER — Ambulatory Visit
Admission: RE | Admit: 2024-06-16 | Discharge: 2024-06-16 | Disposition: A | Source: Ambulatory Visit | Attending: Obstetrics and Gynecology | Admitting: Obstetrics and Gynecology

## 2024-06-16 ENCOUNTER — Ambulatory Visit

## 2024-06-16 DIAGNOSIS — R928 Other abnormal and inconclusive findings on diagnostic imaging of breast: Secondary | ICD-10-CM

## 2024-06-18 ENCOUNTER — Ambulatory Visit: Payer: Self-pay | Admitting: Obstetrics and Gynecology

## 2025-04-25 ENCOUNTER — Ambulatory Visit: Admitting: Obstetrics and Gynecology
